# Patient Record
Sex: Female | Born: 1940
Health system: Southern US, Community
[De-identification: ages and names within clinical notes are randomized; demographics above are authoritative.]

## PROBLEM LIST (undated history)

## (undated) DIAGNOSIS — Z789 Other specified health status: Secondary | ICD-10-CM

## (undated) HISTORY — PX: APPENDECTOMY: SHX54

## (undated) HISTORY — PX: ABDOMINAL HYSTERECTOMY: SHX81

---

## 2008-08-08 ENCOUNTER — Ambulatory Visit: Payer: Self-pay | Admitting: Family Medicine

## 2013-05-15 ENCOUNTER — Ambulatory Visit: Payer: Self-pay | Admitting: Internal Medicine

## 2015-02-23 DIAGNOSIS — Z85828 Personal history of other malignant neoplasm of skin: Secondary | ICD-10-CM | POA: Diagnosis not present

## 2015-02-23 DIAGNOSIS — Z08 Encounter for follow-up examination after completed treatment for malignant neoplasm: Secondary | ICD-10-CM | POA: Diagnosis not present

## 2015-02-23 DIAGNOSIS — Z1283 Encounter for screening for malignant neoplasm of skin: Secondary | ICD-10-CM | POA: Diagnosis not present

## 2018-04-03 DIAGNOSIS — H2510 Age-related nuclear cataract, unspecified eye: Secondary | ICD-10-CM | POA: Diagnosis not present

## 2018-04-03 DIAGNOSIS — H5203 Hypermetropia, bilateral: Secondary | ICD-10-CM | POA: Diagnosis not present

## 2018-04-03 DIAGNOSIS — Z0100 Encounter for examination of eyes and vision without abnormal findings: Secondary | ICD-10-CM | POA: Diagnosis not present

## 2018-04-08 DIAGNOSIS — R030 Elevated blood-pressure reading, without diagnosis of hypertension: Secondary | ICD-10-CM | POA: Diagnosis not present

## 2018-08-06 DIAGNOSIS — L03211 Cellulitis of face: Secondary | ICD-10-CM | POA: Diagnosis not present

## 2019-06-17 ENCOUNTER — Emergency Department
Admission: EM | Admit: 2019-06-17 | Discharge: 2019-06-17 | Disposition: A | Discharge status: Home / Self Care | Payer: Medicare HMO | Attending: Emergency Medicine | Admitting: Emergency Medicine

## 2019-06-17 ENCOUNTER — Emergency Department: Payer: Medicare HMO

## 2019-06-17 ENCOUNTER — Encounter: Payer: Self-pay | Admitting: Emergency Medicine

## 2019-06-17 ENCOUNTER — Ambulatory Visit (INDEPENDENT_AMBULATORY_CARE_PROVIDER_SITE_OTHER): Payer: Medicare HMO

## 2019-06-17 ENCOUNTER — Ambulatory Visit (INDEPENDENT_AMBULATORY_CARE_PROVIDER_SITE_OTHER)
Admission: EM | Admit: 2019-06-17 | Discharge: 2019-06-17 | Disposition: A | Discharge status: Other Acute Inpatient Hospital | Payer: Medicare HMO | Source: Home / Self Care

## 2019-06-17 ENCOUNTER — Other Ambulatory Visit: Payer: Self-pay

## 2019-06-17 DIAGNOSIS — W5532XA Struck by other hoof stock, initial encounter: Secondary | ICD-10-CM | POA: Insufficient documentation

## 2019-06-17 DIAGNOSIS — Y999 Unspecified external cause status: Secondary | ICD-10-CM | POA: Insufficient documentation

## 2019-06-17 DIAGNOSIS — S43015A Anterior dislocation of left humerus, initial encounter: Secondary | ICD-10-CM

## 2019-06-17 DIAGNOSIS — Y93K9 Activity, other involving animal care: Secondary | ICD-10-CM

## 2019-06-17 DIAGNOSIS — S43005D Unspecified dislocation of left shoulder joint, subsequent encounter: Secondary | ICD-10-CM | POA: Diagnosis not present

## 2019-06-17 DIAGNOSIS — Y9289 Other specified places as the place of occurrence of the external cause: Secondary | ICD-10-CM | POA: Insufficient documentation

## 2019-06-17 DIAGNOSIS — S43035A Inferior dislocation of left humerus, initial encounter: Secondary | ICD-10-CM | POA: Diagnosis not present

## 2019-06-17 DIAGNOSIS — M25512 Pain in left shoulder: Secondary | ICD-10-CM | POA: Diagnosis not present

## 2019-06-17 DIAGNOSIS — W2209XA Striking against other stationary object, initial encounter: Secondary | ICD-10-CM | POA: Diagnosis not present

## 2019-06-17 DIAGNOSIS — S4992XA Unspecified injury of left shoulder and upper arm, initial encounter: Secondary | ICD-10-CM | POA: Diagnosis present

## 2019-06-17 MED ORDER — ONDANSETRON HCL 4 MG/2ML IJ SOLN
4.0000 mg | Freq: Once | INTRAMUSCULAR | Status: AC
  Administered 2019-06-17: 4 mg via INTRAVENOUS
  Filled 2019-06-17: qty 2

## 2019-06-17 MED ORDER — ETOMIDATE 2 MG/ML IV SOLN
0.3000 mg/kg | Freq: Once | INTRAVENOUS | Status: AC
  Administered 2019-06-17: 19.74 mg via INTRAVENOUS
  Filled 2019-06-17: qty 10

## 2019-06-17 MED ORDER — SODIUM CHLORIDE 0.9 % IV BOLUS
1000.0000 mL | Freq: Once | INTRAVENOUS | Status: AC
  Administered 2019-06-17: 1000 mL via INTRAVENOUS

## 2019-06-17 MED ORDER — MORPHINE SULFATE (PF) 2 MG/ML IV SOLN
2.0000 mg | Freq: Once | INTRAVENOUS | Status: AC
  Administered 2019-06-17: 2 mg via INTRAVENOUS
  Filled 2019-06-17: qty 1

## 2019-06-17 MED ORDER — MORPHINE SULFATE (PF) 4 MG/ML IV SOLN
4.0000 mg | Freq: Once | INTRAVENOUS | Status: AC
  Administered 2019-06-17: 4 mg via INTRAVENOUS
  Filled 2019-06-17: qty 1

## 2019-06-17 MED ORDER — OXYCODONE-ACETAMINOPHEN 5-325 MG PO TABS: 1.0000 | ORAL_TABLET | Freq: Four times a day (QID) | ORAL | 0 refills | Status: AC | PRN

## 2019-06-17 MED ORDER — IBUPROFEN 200 MG PO TABS: 600.0000 mg | ORAL_TABLET | Freq: Four times a day (QID) | ORAL | 0 refills | Status: DC | PRN

## 2019-06-17 NOTE — ED Notes (Signed)
X-ray at bedside

## 2019-06-17 NOTE — ED Triage Notes (Signed)
Pt sent from urgent care for dislocated left shoulder. States she was holding the donkey for the farrier to trim his feet and it slung her up against a fence post yesterday.

## 2019-06-17 NOTE — ED Provider Notes (Signed)
[facility] Emergency Department Provider Note  ____________________________________________  Time seen: Approximately 12:25 PM  I have reviewed the triage vital signs and the nursing notes.   HISTORY  Chief Complaint Shoulder Pain    HPI Connie Lee is a 78 y.o. female with no significant past medical history who comes the ED complaining of left shoulder pain that is acute in onset that started yesterday after she was slung against a fence by a donkey.  She was helping change the donkey shoes with it reacted.  Denies any pain anywhere except in her left shoulder.  Hurts to move it, she feels like she cannot lift it.  No headache or neck pain or loss of consciousness.  No shortness of breath chest pain vomiting or diarrhea.   Pain is constant and severe.  A little bit alleviated by holding the arm stationary.     History reviewed. No pertinent past medical history.   There are no active problems to display for this patient.    Past Surgical History:  Procedure Laterality Date  . ABDOMINAL HYSTERECTOMY    . APPENDECTOMY       Prior to Admission medications   Medication Sig Start Date End Date Taking? Authorizing Provider  ibuprofen (MOTRIN IB) 200 MG tablet Take 3 tablets (600 mg total) by mouth every 6 (six) hours as needed. 06/17/19   Sharman CheekStafford, Fleming Prill, MD  oxyCODONE-acetaminophen (PERCOCET) 5-325 MG tablet Take 1 tablet by mouth every 6 (six) hours as needed for severe pain. 06/17/19 06/16/20  Sharman CheekStafford, Shelbia Scinto, MD     Allergies Patient has no known allergies.   No family history on file.  Social History Social History   Tobacco Use  . Smoking status: Never Smoker  . Smokeless tobacco: Never Used  Substance Use Topics  . Alcohol use: Not Currently  . Drug use: Not Currently    Review of Systems  Constitutional:   No fever or chills.  ENT:   No sore throat. No rhinorrhea. Cardiovascular:   No chest pain or  syncope. Respiratory:   No dyspnea or cough. Gastrointestinal:   Negative for abdominal pain, vomiting and diarrhea.  Musculoskeletal:   Left shoulder pain as above All other systems reviewed and are negative except as documented above in ROS and HPI.  ____________________________________________   PHYSICAL EXAM:  VITAL SIGNS: ED Triage Vitals [06/17/19 1112]  Enc Vitals Group     BP (!) 182/84     Pulse Rate 77     Resp 18     Temp 98.2 F (36.8 C)     Temp Source Oral     SpO2 99 %     Weight 145 lb (65.8 kg)     Height 5\' 5"  (1.651 m)     Head Circumference      Peak Flow      Pain Score 9     Pain Loc      Pain Edu?      Excl. in GC?     Vital signs reviewed, nursing assessments reviewed.   Constitutional:   Alert and oriented. Non-toxic appearance. Eyes:   Conjunctivae are normal. EOMI. PERRL. ENT      Head:   Normocephalic and atraumatic.      Nose:   No congestion/rhinnorhea.       Mouth/Throat:   MMM, no pharyngeal erythema. No peritonsillar mass.       Neck:   No meningismus. Full ROM.  No midline neck tenderness Hematological/Lymphatic/Immunilogical:  No cervical lymphadenopathy. Cardiovascular:   RRR. Symmetric bilateral radial and DP pulses.  No murmurs. Cap refill less than 2 seconds. Respiratory:   Normal respiratory effort without tachypnea/retractions. Breath sounds are clear and equal bilaterally. No wheezes/rales/rhonchi. Gastrointestinal:   Soft and nontender. Non distended. There is no CVA tenderness.  No rebound, rigidity, or guarding.  Musculoskeletal:   Limited range of motion of the left shoulder, pain with left shoulder range of motion.  Relative emptiness of the subacromial space at the left shoulder with fullness anteriorly.  No focal bony tenderness.  Distal extremities unremarkable, nontender, compartments soft without swelling, normal distal perfusion sensation and motor function. Neurologic:   Normal speech and language.  Motor grossly  intact. Axillary nerve intact No acute focal neurologic deficits are appreciated.  Skin:    Skin is warm, dry and intact. No rash noted.  No petechiae, purpura, or bullae.  ____________________________________________    LABS (pertinent positives/negatives) (all labs ordered are listed, but only abnormal results are displayed) Labs Reviewed - No data to display ____________________________________________   EKG    ____________________________________________    RADIOLOGY  Dg Shoulder Left  Result Date: 06/17/2019 CLINICAL DATA:  Fall.  Shoulder pain. EXAM: LEFT SHOULDER - 2+ VIEW COMPARISON:  None. FINDINGS: There Is anterior inferior dislocation of the left humeral head. No visible fracture. Soft tissues are intact. IMPRESSION: Anterior inferior dislocation of the left humeral head. Electronically Signed   By: Rolm Baptise M.D.   On: 06/17/2019 10:26   Dg Shoulder Left Portable  Result Date: 06/17/2019 CLINICAL DATA:  Status post reduction of dislocation. EXAM: LEFT SHOULDER - 1 VIEW COMPARISON:  None. FINDINGS: There is no evidence of fracture or dislocation. There is no evidence of arthropathy or other focal bone abnormality. Soft tissues are unremarkable. IMPRESSION: Negative. Electronically Signed   By: Marijo Conception M.D.   On: 06/17/2019 14:37    ____________________________________________   PROCEDURES .Ortho Injury Treatment  Date/Time: 06/17/2019 2:57 PM Performed by: Carrie Mew, MD Authorized by: Carrie Mew, MD   Consent:    Consent obtained:  Verbal   Consent given by:  Patient   Risks discussed:  Fracture, nerve damage, recurrent dislocation and irreducible dislocation   Alternatives discussed:  ReferralInjury location: shoulder Location details: left shoulder Injury type: dislocation Dislocation type: anterior Hill-Sachs deformity: no Chronicity: new Pre-procedure neurovascular assessment: neurovascularly intact Pre-procedure distal  perfusion: normal Pre-procedure neurological function: normal Pre-procedure range of motion: reduced  Anesthesia: Local anesthesia used: no  Patient sedated: Yes. Refer to sedation procedure documentation for details of sedation. Manipulation performed: yes Reduction method: traction and counter traction Reduction successful: yes X-ray confirmed reduction: yes Immobilization: sling (Shoulder immobilizer) Post-procedure neurovascular assessment: post-procedure neurovascularly intact Post-procedure distal perfusion: normal Post-procedure neurological function: normal Post-procedure range of motion: improved Patient tolerance: patient tolerated the procedure well with no immediate complications Comments:      .Sedation  Date/Time: 06/17/2019 3:00 PM Performed by: Carrie Mew, MD Authorized by: Carrie Mew, MD   Consent:    Consent obtained:  Written (electronic informed consent)   Consent given by:  Patient   Risks discussed:  Allergic reaction, dysrhythmia, inadequate sedation, nausea, vomiting, respiratory compromise necessitating ventilatory assistance and intubation, prolonged sedation necessitating reversal and prolonged hypoxia resulting in organ damage   Alternatives discussed:  Analgesia without sedation Universal protocol:    Procedure explained and questions answered to patient or proxy's satisfaction: yes     Relevant documents present and verified: yes  Test results available and properly labeled: yes     Imaging studies available: yes     Required blood products, implants, devices, and special equipment available: yes     Immediately prior to procedure a time out was called: yes     Patient identity confirmation method:  Arm band, provided demographic data and verbally with patient Indications:    Procedure performed:  Dislocation reduction   Procedure necessitating sedation performed by:  Physician performing sedation Pre-sedation assessment:     Time since last food or drink:  8 hours   ASA classification: class 1 - normal, healthy patient     Neck mobility: normal     Mouth opening:  3 or more finger widths   Thyromental distance:  4 finger widths   Mallampati score:  I - soft palate, uvula, fauces, pillars visible   Pre-sedation assessments completed and reviewed: airway patency, cardiovascular function, hydration status, mental status, nausea/vomiting, pain level, respiratory function and temperature     Pre-sedation assessment completed:  06/17/2019 12:30 PM Immediate pre-procedure details:    Reassessment: Patient reassessed immediately prior to procedure     Reviewed: vital signs, relevant labs/tests and NPO status     Verified: bag valve mask available, emergency equipment available, intubation equipment available, IV patency confirmed, oxygen available, reversal medications available and suction available   Procedure details (see MAR for exact dosages):    Preoxygenation:  Room air   Sedation:  Etomidate   Analgesia:  Morphine   Intra-procedure monitoring:  Blood pressure monitoring, continuous pulse oximetry, cardiac monitor, frequent vital sign checks, frequent LOC assessments and continuous capnometry   Intra-procedure events: none     Total Provider sedation time (minutes):  20 Post-procedure details:    Post-sedation assessment completed:  06/17/2019 3:02 PM   Attendance: Constant attendance by certified staff until patient recovered     Recovery: Patient returned to pre-procedure baseline     Post-sedation assessments completed and reviewed: airway patency, cardiovascular function, hydration status, mental status, nausea/vomiting, pain level and respiratory function     Patient is stable for discharge or admission: yes     Patient tolerance:  Tolerated well, no immediate complications    ____________________________________________    CLINICAL IMPRESSION / ASSESSMENT AND PLAN / ED COURSE  Medications ordered in  the ED: Medications  morphine 4 MG/ML injection 4 mg (4 mg Intravenous Given 06/17/19 1243)  ondansetron (ZOFRAN) injection 4 mg (4 mg Intravenous Given 06/17/19 1243)  etomidate (AMIDATE) injection 19.74 mg (19.74 mg Intravenous Given 06/17/19 1343)  sodium chloride 0.9 % bolus 1,000 mL (0 mLs Intravenous Stopped 06/17/19 1456)  morphine 2 MG/ML injection 2 mg (2 mg Intravenous Given 06/17/19 1436)    Pertinent labs & imaging results that were available during my care of the patient were reviewed by me and considered in my medical decision making (see chart for details).  Connie Lee was evaluated in Emergency Department on 06/17/2019 for the symptoms described in the history of present illness. She was evaluated in the context of the global COVID-19 pandemic, which necessitated consideration that the patient might be at risk for infection with the SARS-CoV-2 virus that causes COVID-19. Institutional protocols and algorithms that pertain to the evaluation of patients at risk for COVID-19 are in a state of rapid change based on information released by regulatory bodies including the CDC and federal and state organizations. These policies and algorithms were followed during the patient's care in the ED.   Patient arrives  with left shoulder pain.  X-ray done at urgent care viewable in the chart shows an anterior inferior shoulder dislocation without any fracture.  Patient verbally consents to deep sedation for reduction.  N.p.o. since 6 AM.  No food or medication allergies, no other comorbidities or medications.  Plan to use morphine and Zofran for pain control, IV fluids, etomidate.  Clinical Course as of Jun 16 1456  Tue Jun 17, 2019  1357 Sedation and reduction completed without difficulty.  Patient awakened within a few minutes after immobilizer was placed.   [PS]    Clinical Course User Index [PS] Sharman CheekStafford, Jazzman Loughmiller, MD    ----------------------------------------- 2:57 PM on  06/17/2019 -----------------------------------------  Follow-up shoulder x-ray unremarkable without evidence of dislocation or fracture.  Stable for discharge home and follow-up with orthopedics.  Pain controlled, vital signs unremarkable, lucid.   ____________________________________________   FINAL CLINICAL IMPRESSION(S) / ED DIAGNOSES    Final diagnoses:  Closed anterior dislocation of left shoulder, initial encounter     ED Discharge Orders         Ordered    oxyCODONE-acetaminophen (PERCOCET) 5-325 MG tablet  Every 6 hours PRN     06/17/19 1457    ibuprofen (MOTRIN IB) 200 MG tablet  Every 6 hours PRN     06/17/19 1457          Portions of this note were generated with dragon dictation software. Dictation errors may occur despite best attempts at proofreading.   Sharman CheekStafford, Vasilia Dise, MD 06/17/19 [phone]

## 2019-06-17 NOTE — Discharge Instructions (Addendum)
Keep the shoulder immobilizer on at all times to prevent recurrent injury of your shoulder and allow your shoulder to heal. Follow up with orthopedics in 1 week for reassessment.

## 2019-06-17 NOTE — ED Triage Notes (Signed)
Pt c/o left shoulder pain. She fell yesterday morning. She states her shoulder is better than it was yesterday. No other injuries

## 2019-06-17 NOTE — ED Provider Notes (Signed)
[place]   Name: Connie Lee DOB: 21-Sep-1941 MRN: [id number] CSN: [id number] PCP: System, Pcp Not In  Arrival date and time:  06/17/19 2376  Chief Complaint:  Fall   NOTE: Prior to seeing the patient today, I have reviewed the triage nursing documentation and vital signs. Clinical staff has updated patient's PMH/PSHx, current medication list, and drug allergies/intolerances to ensure comprehensive history available to assist in medical decision making.   History:   HPI: Connie Lee is a 78 y.o. female who presents today with complaints of LEFT shoulder pain following a fall that occurred yesterday. Patient describes that injury occurred when she was assisting the farrier with her animals when the donkey kicked her and "slammed her" into a metal gate. Incident occurred yesterday morning around 9 or 10. Patient initially unable to abduct extremity, however throughout the day yesterday, she developed more mobility in her LEFT upper extremity. Patient reporting that the pain exacerbated through the night to the point of causing her to be unable to sleep. Patient denies previous injuries to her shoulder; no surgeries. Last took 3 Tylenol tablets at around 0600 this morning, which "help very little".  Patient presents with pain rated 9/10.  History reviewed. No pertinent past medical history.  Past Surgical History:  Procedure Laterality Date   ABDOMINAL HYSTERECTOMY     APPENDECTOMY      History reviewed. No pertinent family history.  Social History   Tobacco Use   Smoking status: Never Smoker   Smokeless tobacco: Never Used  Substance Use Topics   Alcohol use: Not Currently   Drug use: Not Currently    There are no active problems to display for this patient.   Home Medications:    No outpatient medications have been marked as taking for the 06/17/19 encounter [facility] Encounter).    Allergies:   Patient has no known allergies.  Review of Systems  (ROS): Review of Systems  Constitutional: Negative for chills and fever.  Respiratory: Negative for cough and shortness of breath.   Cardiovascular: Negative for chest pain and palpitations.  Musculoskeletal:       Acute LEFT shoulder pain  Neurological: Positive for weakness (LUE 2/2 acute injury). Negative for dizziness, syncope and headaches.  All other systems reviewed and are negative.    Vital Signs: Today's Vitals   06/17/19 0940 06/17/19 0944 06/17/19 0946 06/17/19 1044  BP:  (!) 165/111 (!) 177/80   Pulse:  (!) 106    Resp:  18    Temp:  98.3 F (36.8 C)    TempSrc:  Oral    SpO2:  100%    Weight: 145 lb (65.8 kg)     Height: 5\' 5"  (1.651 m)     PainSc: 9    9     Physical Exam: Physical Exam  Constitutional: She is oriented to person, place, and time and well-developed, well-nourished, and in no distress.  HENT:  Head: Normocephalic and atraumatic.  Mouth/Throat: Mucous membranes are normal.  Cardiovascular: Normal rate, regular rhythm, normal heart sounds and intact distal pulses. Exam reveals no gallop and no friction rub.  No murmur heard. Pulmonary/Chest: Effort normal and breath sounds normal. No respiratory distress. She has no wheezes. She has no rales.  Musculoskeletal:     Left shoulder: She exhibits decreased range of motion, tenderness, swelling, deformity, pain and decreased strength. She exhibits normal pulse.     Comments: LUE reveals (+) PMS distally; cap refill and temperature normal. Exam limited  by decreased AROM/PROM due to pain.   Neurological: She is alert and oriented to person, place, and time. Gait normal. GCS score is 15.  Skin: Skin is warm and dry. No rash noted.  Psychiatric: Mood, memory, affect and judgment normal.  Nursing note and vitals reviewed.  Urgent Care Treatments / Results:   LABS: PLEASE NOTE: all labs that were ordered this encounter are listed, however only abnormal results are displayed. Labs Reviewed - No data to  display  EKG: -None  RADIOLOGY: Dg Shoulder Left  Result Date: 06/17/2019 CLINICAL DATA:  Fall.  Shoulder pain. EXAM: LEFT SHOULDER - 2+ VIEW COMPARISON:  None. FINDINGS: There Is anterior inferior dislocation of the left humeral head. No visible fracture. Soft tissues are intact. IMPRESSION: Anterior inferior dislocation of the left humeral head. Electronically Signed   By: Charlett NoseKevin  Dover M.D.   On: 06/17/2019 10:26    PROCEDURES: Procedures  MEDICATIONS RECEIVED THIS VISIT: Medications - No data to display  PERTINENT CLINICAL COURSE NOTES/UPDATES: Clinical Course as of Jun 16 1048  Tue Jun 17, 2019  1040 [facility] (spoke with Laural BenesJohnson, RN). Patient being sent for reduction of anterior shoulder dislocation, as we have no medications for pain/sedation in the UC setting.    [BG]    Clinical Course User Index [BG] Verlee MonteGray, Kaori Jumper E, NP   Initial Impression / Assessment and Plan / Urgent Care Course:  Pertinent labs & imaging results that were available during my care of the patient were personally reviewed by me and considered in my medical decision making (see lab/imaging section of note for values and interpretations).  Connie Lee is a 78 y.o. female who [facility] today with complaints of shoulder pain following an animal related injury that occurred yesterday.    Patient presents to clinic today in obvious discomfort. She does not appear to be in any acute distress. Presenting symptoms (see HPI) and exam as documented above. Diagnostic radiographs reveals an anterior inferior dislocation of the LEFT humeral head. Patient is in significant pain. Discussed results with patient and her daughter. Shoulder has been dislocated since yesterday. Limited on capabilities in the UC setting as we do not have medications for pain or sedation. Reviewed with patient that she would be better served in the emergency department where the closed reduction  could be performed under moderate sedation. Patient and daughter in agreement. Plans are for them to proceed to the ED at [facility] directly from clinic. Report called to charge nurse (see clinical notes).   Final Clinical Impressions / Urgent Care Diagnoses:   Final diagnoses:  Anterior shoulder dislocation, left, initial encounter    New Prescriptions:  [place] Controlled Substance Registry consulted? Not Applicable  No orders of the defined types were placed in this encounter.   Recommended Follow up Care:  Patient encouraged to follow up with the following provider within the specified time frame, or sooner as dictated by the severity of her symptoms. As always, she was instructed that for any urgent/emergent care needs, she should seek care either here or in the emergency department for more immediate evaluation.  Follow-up Information    Go to  [facility] EMERGENCY DEPARTMENT.   Specialty: Emergency Medicine Contact information: [address] [id number] [address] [place] [id number] [phone]        NOTE: This note was prepared using Dragon dictation software along with smaller phrase technology. Despite my best ability to proofread, there  is the potential that transcriptional errors may still occur from this process, and are completely unintentional.    Verlee MonteGray, Ilija Maxim E, NP 06/18/19 [phone]

## 2019-06-17 NOTE — ED Notes (Signed)
Pt awake and talking at this time. Moving all extremities, skin color is WNL. VSS.

## 2019-06-17 NOTE — Sedation Documentation (Signed)
Shoulder feels back in place at this time per MD [facility].

## 2019-06-17 NOTE — Discharge Instructions (Signed)
Your shoulder is dislocated. Leave here and go directly to the ER. I have notified them that you are coming.   Honor Loh, MSN, APRN, FNP-C, CEN Advanced Practice Provider [facility] 06/17/2019 10:40 AM

## 2019-06-17 NOTE — ED Notes (Signed)
Pt arrive to ED ambulatory, was seen at [facility] and informed L shoulder dislocated. Pt was working with a donkey yesterday to get shoes on the donkey and donkey shoved pt into metal fence. Pt was initially diaphoretic from pain. This happened yesterday. States took 3 tylenol this AM. A&O, daughter at bedside.

## 2019-06-25 DIAGNOSIS — S43015A Anterior dislocation of left humerus, initial encounter: Secondary | ICD-10-CM | POA: Diagnosis not present

## 2019-07-23 DIAGNOSIS — S43015A Anterior dislocation of left humerus, initial encounter: Secondary | ICD-10-CM | POA: Diagnosis not present

## 2019-07-31 DIAGNOSIS — M7582 Other shoulder lesions, left shoulder: Secondary | ICD-10-CM | POA: Diagnosis not present

## 2019-07-31 DIAGNOSIS — M25612 Stiffness of left shoulder, not elsewhere classified: Secondary | ICD-10-CM | POA: Diagnosis not present

## 2019-07-31 DIAGNOSIS — M25512 Pain in left shoulder: Secondary | ICD-10-CM | POA: Diagnosis not present

## 2019-08-04 DIAGNOSIS — M7582 Other shoulder lesions, left shoulder: Secondary | ICD-10-CM | POA: Diagnosis not present

## 2019-08-04 DIAGNOSIS — M25512 Pain in left shoulder: Secondary | ICD-10-CM | POA: Diagnosis not present

## 2019-08-04 DIAGNOSIS — M25612 Stiffness of left shoulder, not elsewhere classified: Secondary | ICD-10-CM | POA: Diagnosis not present

## 2019-08-06 DIAGNOSIS — M7582 Other shoulder lesions, left shoulder: Secondary | ICD-10-CM | POA: Diagnosis not present

## 2019-08-06 DIAGNOSIS — M25512 Pain in left shoulder: Secondary | ICD-10-CM | POA: Diagnosis not present

## 2019-08-06 DIAGNOSIS — M25612 Stiffness of left shoulder, not elsewhere classified: Secondary | ICD-10-CM | POA: Diagnosis not present

## 2019-08-11 DIAGNOSIS — M25512 Pain in left shoulder: Secondary | ICD-10-CM | POA: Diagnosis not present

## 2019-08-11 DIAGNOSIS — M7582 Other shoulder lesions, left shoulder: Secondary | ICD-10-CM | POA: Diagnosis not present

## 2019-08-11 DIAGNOSIS — M25612 Stiffness of left shoulder, not elsewhere classified: Secondary | ICD-10-CM | POA: Diagnosis not present

## 2019-08-13 DIAGNOSIS — M7582 Other shoulder lesions, left shoulder: Secondary | ICD-10-CM | POA: Diagnosis not present

## 2019-08-13 DIAGNOSIS — M25612 Stiffness of left shoulder, not elsewhere classified: Secondary | ICD-10-CM | POA: Diagnosis not present

## 2019-08-13 DIAGNOSIS — M25512 Pain in left shoulder: Secondary | ICD-10-CM | POA: Diagnosis not present

## 2019-08-18 DIAGNOSIS — M7582 Other shoulder lesions, left shoulder: Secondary | ICD-10-CM | POA: Diagnosis not present

## 2019-08-18 DIAGNOSIS — M25612 Stiffness of left shoulder, not elsewhere classified: Secondary | ICD-10-CM | POA: Diagnosis not present

## 2019-08-18 DIAGNOSIS — M25512 Pain in left shoulder: Secondary | ICD-10-CM | POA: Diagnosis not present

## 2019-08-20 DIAGNOSIS — M25512 Pain in left shoulder: Secondary | ICD-10-CM | POA: Diagnosis not present

## 2019-08-20 DIAGNOSIS — M25612 Stiffness of left shoulder, not elsewhere classified: Secondary | ICD-10-CM | POA: Diagnosis not present

## 2019-08-20 DIAGNOSIS — M7582 Other shoulder lesions, left shoulder: Secondary | ICD-10-CM | POA: Diagnosis not present

## 2019-08-20 DIAGNOSIS — S43015A Anterior dislocation of left humerus, initial encounter: Secondary | ICD-10-CM | POA: Diagnosis not present

## 2019-08-25 DIAGNOSIS — M25612 Stiffness of left shoulder, not elsewhere classified: Secondary | ICD-10-CM | POA: Diagnosis not present

## 2019-08-25 DIAGNOSIS — M25512 Pain in left shoulder: Secondary | ICD-10-CM | POA: Diagnosis not present

## 2019-08-25 DIAGNOSIS — M7582 Other shoulder lesions, left shoulder: Secondary | ICD-10-CM | POA: Diagnosis not present

## 2019-08-27 DIAGNOSIS — M25512 Pain in left shoulder: Secondary | ICD-10-CM | POA: Diagnosis not present

## 2019-08-27 DIAGNOSIS — M7582 Other shoulder lesions, left shoulder: Secondary | ICD-10-CM | POA: Diagnosis not present

## 2019-08-27 DIAGNOSIS — M25612 Stiffness of left shoulder, not elsewhere classified: Secondary | ICD-10-CM | POA: Diagnosis not present

## 2019-09-02 DIAGNOSIS — M25612 Stiffness of left shoulder, not elsewhere classified: Secondary | ICD-10-CM | POA: Diagnosis not present

## 2019-09-02 DIAGNOSIS — M25512 Pain in left shoulder: Secondary | ICD-10-CM | POA: Diagnosis not present

## 2019-09-02 DIAGNOSIS — M7582 Other shoulder lesions, left shoulder: Secondary | ICD-10-CM | POA: Diagnosis not present

## 2019-09-09 DIAGNOSIS — M25612 Stiffness of left shoulder, not elsewhere classified: Secondary | ICD-10-CM | POA: Diagnosis not present

## 2019-09-09 DIAGNOSIS — M25512 Pain in left shoulder: Secondary | ICD-10-CM | POA: Diagnosis not present

## 2019-09-09 DIAGNOSIS — M7582 Other shoulder lesions, left shoulder: Secondary | ICD-10-CM | POA: Diagnosis not present

## 2019-09-16 DIAGNOSIS — M25612 Stiffness of left shoulder, not elsewhere classified: Secondary | ICD-10-CM | POA: Diagnosis not present

## 2019-09-16 DIAGNOSIS — M25512 Pain in left shoulder: Secondary | ICD-10-CM | POA: Diagnosis not present

## 2019-09-16 DIAGNOSIS — M7582 Other shoulder lesions, left shoulder: Secondary | ICD-10-CM | POA: Diagnosis not present

## 2019-12-30 DIAGNOSIS — H5203 Hypermetropia, bilateral: Secondary | ICD-10-CM | POA: Diagnosis not present

## 2019-12-30 DIAGNOSIS — H35363 Drusen (degenerative) of macula, bilateral: Secondary | ICD-10-CM | POA: Diagnosis not present

## 2019-12-30 DIAGNOSIS — H18593 Other hereditary corneal dystrophies, bilateral: Secondary | ICD-10-CM | POA: Diagnosis not present

## 2019-12-30 DIAGNOSIS — H2513 Age-related nuclear cataract, bilateral: Secondary | ICD-10-CM | POA: Diagnosis not present

## 2019-12-30 DIAGNOSIS — Z0100 Encounter for examination of eyes and vision without abnormal findings: Secondary | ICD-10-CM | POA: Diagnosis not present

## 2020-10-07 DIAGNOSIS — R001 Bradycardia, unspecified: Secondary | ICD-10-CM | POA: Diagnosis not present

## 2020-10-07 DIAGNOSIS — R197 Diarrhea, unspecified: Secondary | ICD-10-CM | POA: Diagnosis not present

## 2020-10-07 DIAGNOSIS — R748 Abnormal levels of other serum enzymes: Secondary | ICD-10-CM | POA: Diagnosis not present

## 2020-10-07 DIAGNOSIS — R110 Nausea: Secondary | ICD-10-CM | POA: Diagnosis not present

## 2020-10-07 DIAGNOSIS — U071 COVID-19: Secondary | ICD-10-CM | POA: Diagnosis not present

## 2020-10-07 DIAGNOSIS — R531 Weakness: Secondary | ICD-10-CM | POA: Diagnosis not present

## 2020-10-07 DIAGNOSIS — R918 Other nonspecific abnormal finding of lung field: Secondary | ICD-10-CM | POA: Diagnosis not present

## 2020-10-07 DIAGNOSIS — R7989 Other specified abnormal findings of blood chemistry: Secondary | ICD-10-CM | POA: Diagnosis not present

## 2020-10-07 DIAGNOSIS — K858 Other acute pancreatitis without necrosis or infection: Secondary | ICD-10-CM | POA: Diagnosis not present

## 2020-10-07 DIAGNOSIS — R0602 Shortness of breath: Secondary | ICD-10-CM | POA: Diagnosis not present

## 2020-10-08 IMAGING — CR LEFT SHOULDER - 2+ VIEW
2 series · 2 of 2 positions shown · non-contrast
Comparison: None.

CLINICAL DATA: Fall.  Shoulder pain.

EXAM:
LEFT SHOULDER - 2+ VIEW

[shoulder grashey]
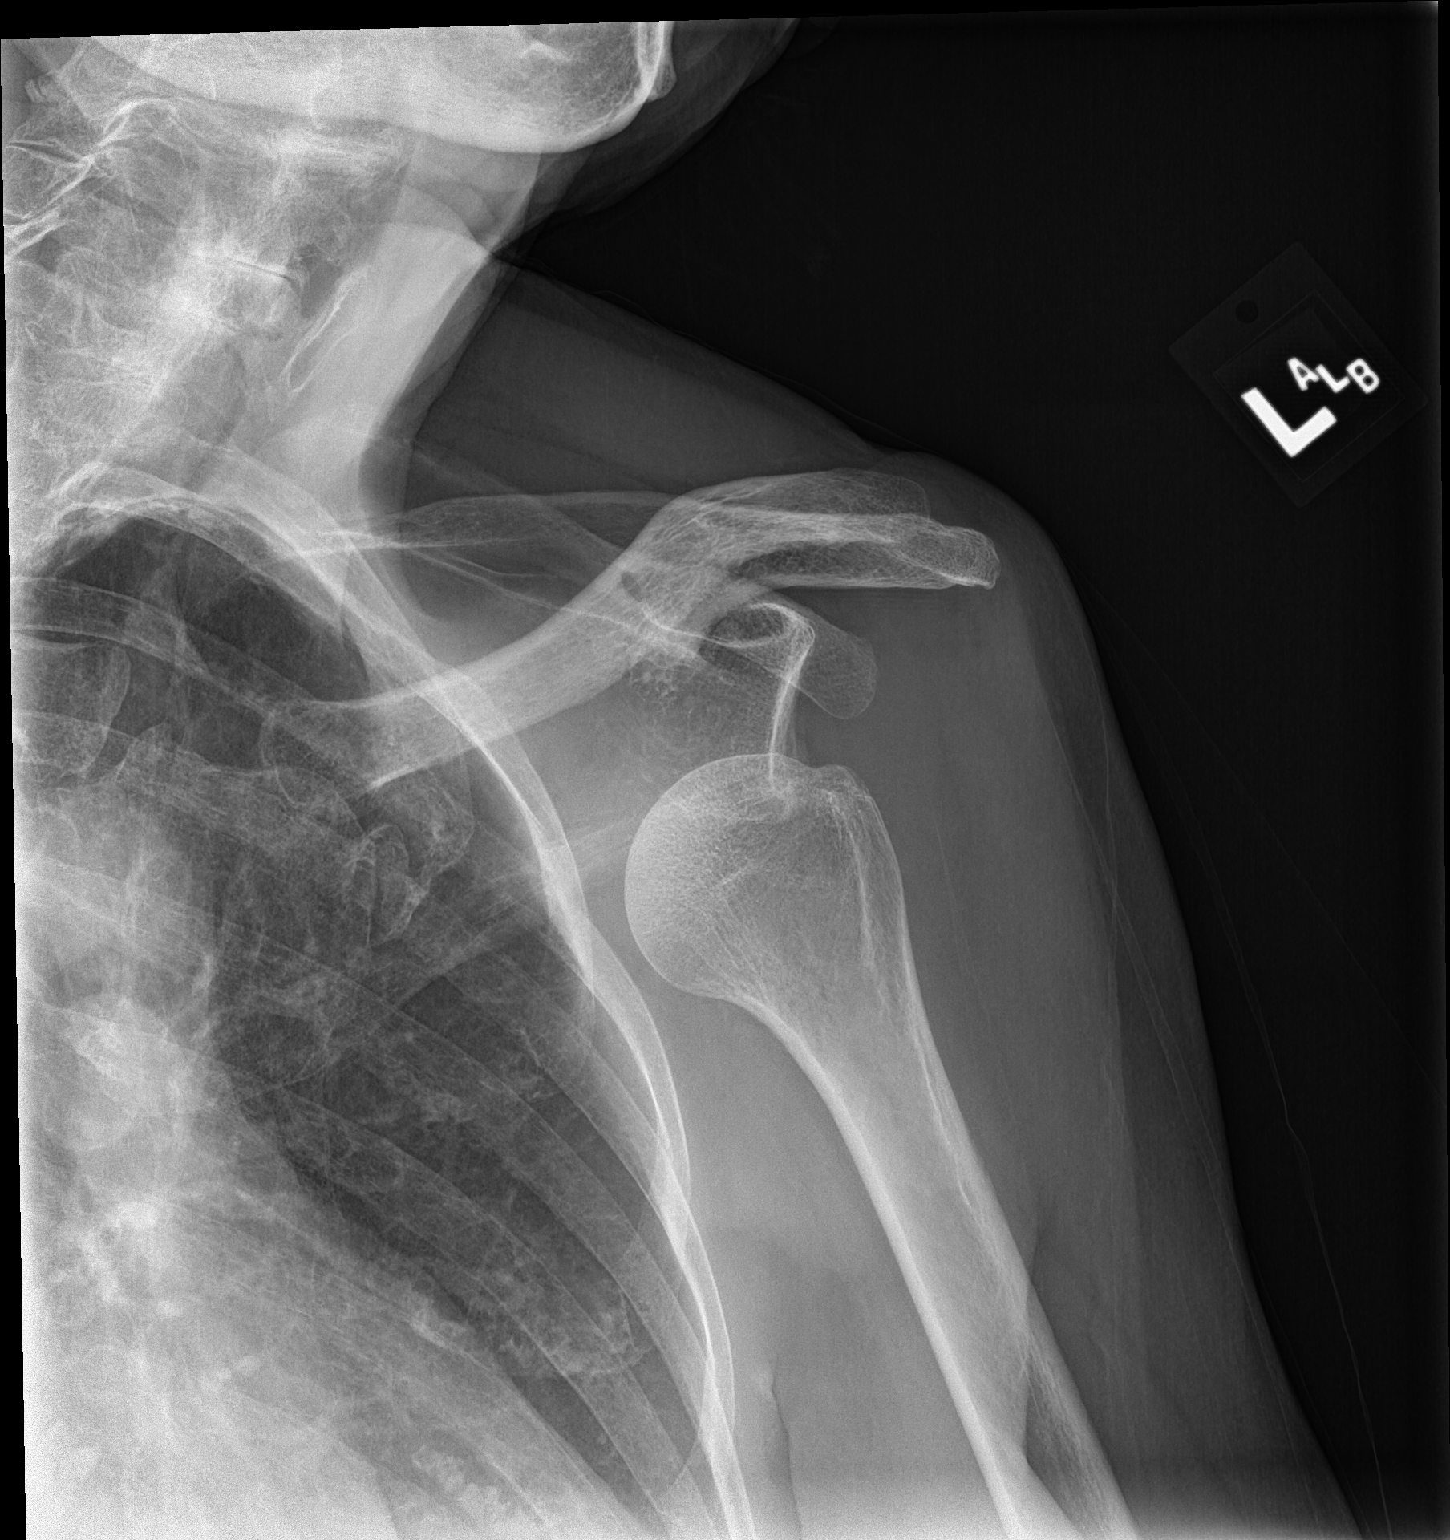

[shoulder y view]
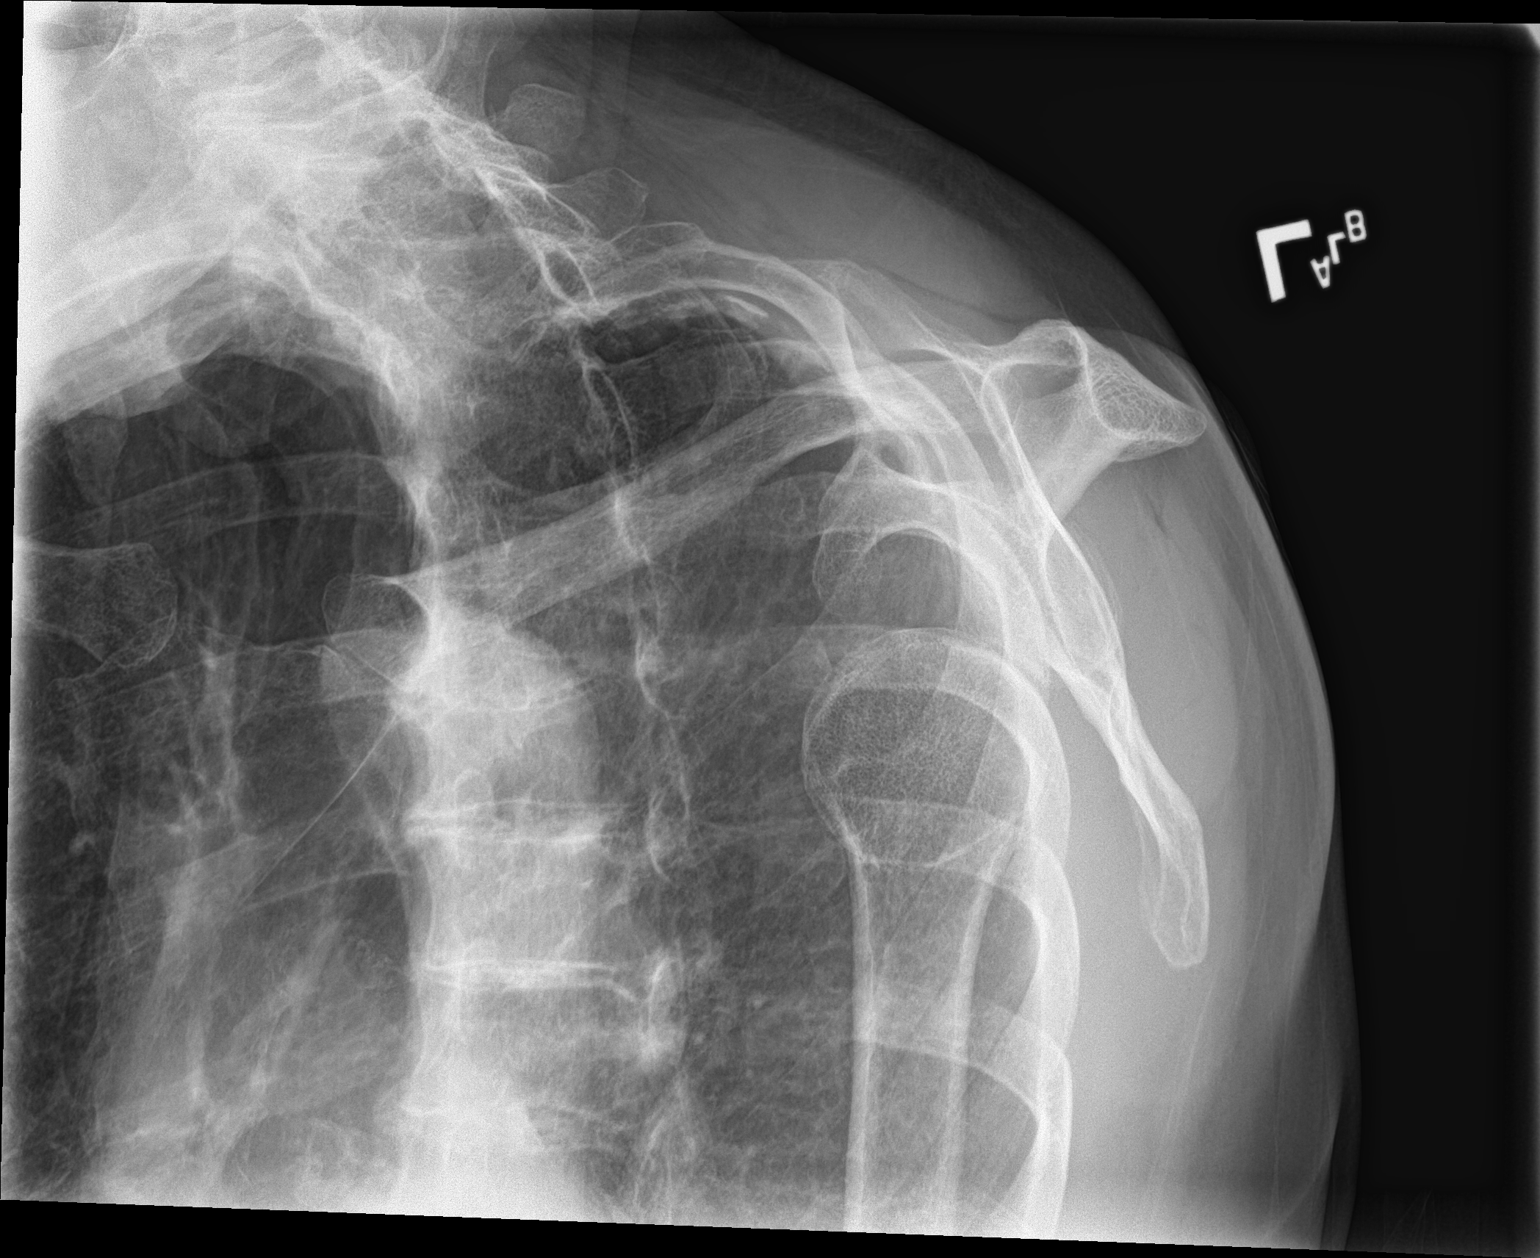

[2 of 2 positions shown; findings below may reference images not displayed]

FINDINGS: There Is anterior inferior dislocation of the left humeral head. No
visible fracture. Soft tissues are intact.
IMPRESSION: Anterior inferior dislocation of the left humeral head.

## 2020-10-08 IMAGING — DX LEFT SHOULDER - 1 VIEW
2 series · 2 of 2 positions shown · non-contrast
Comparison: None.

CLINICAL DATA: Status post reduction of dislocation.

EXAM:
LEFT SHOULDER - 1 VIEW

[shoulder obl (1 of 2)]
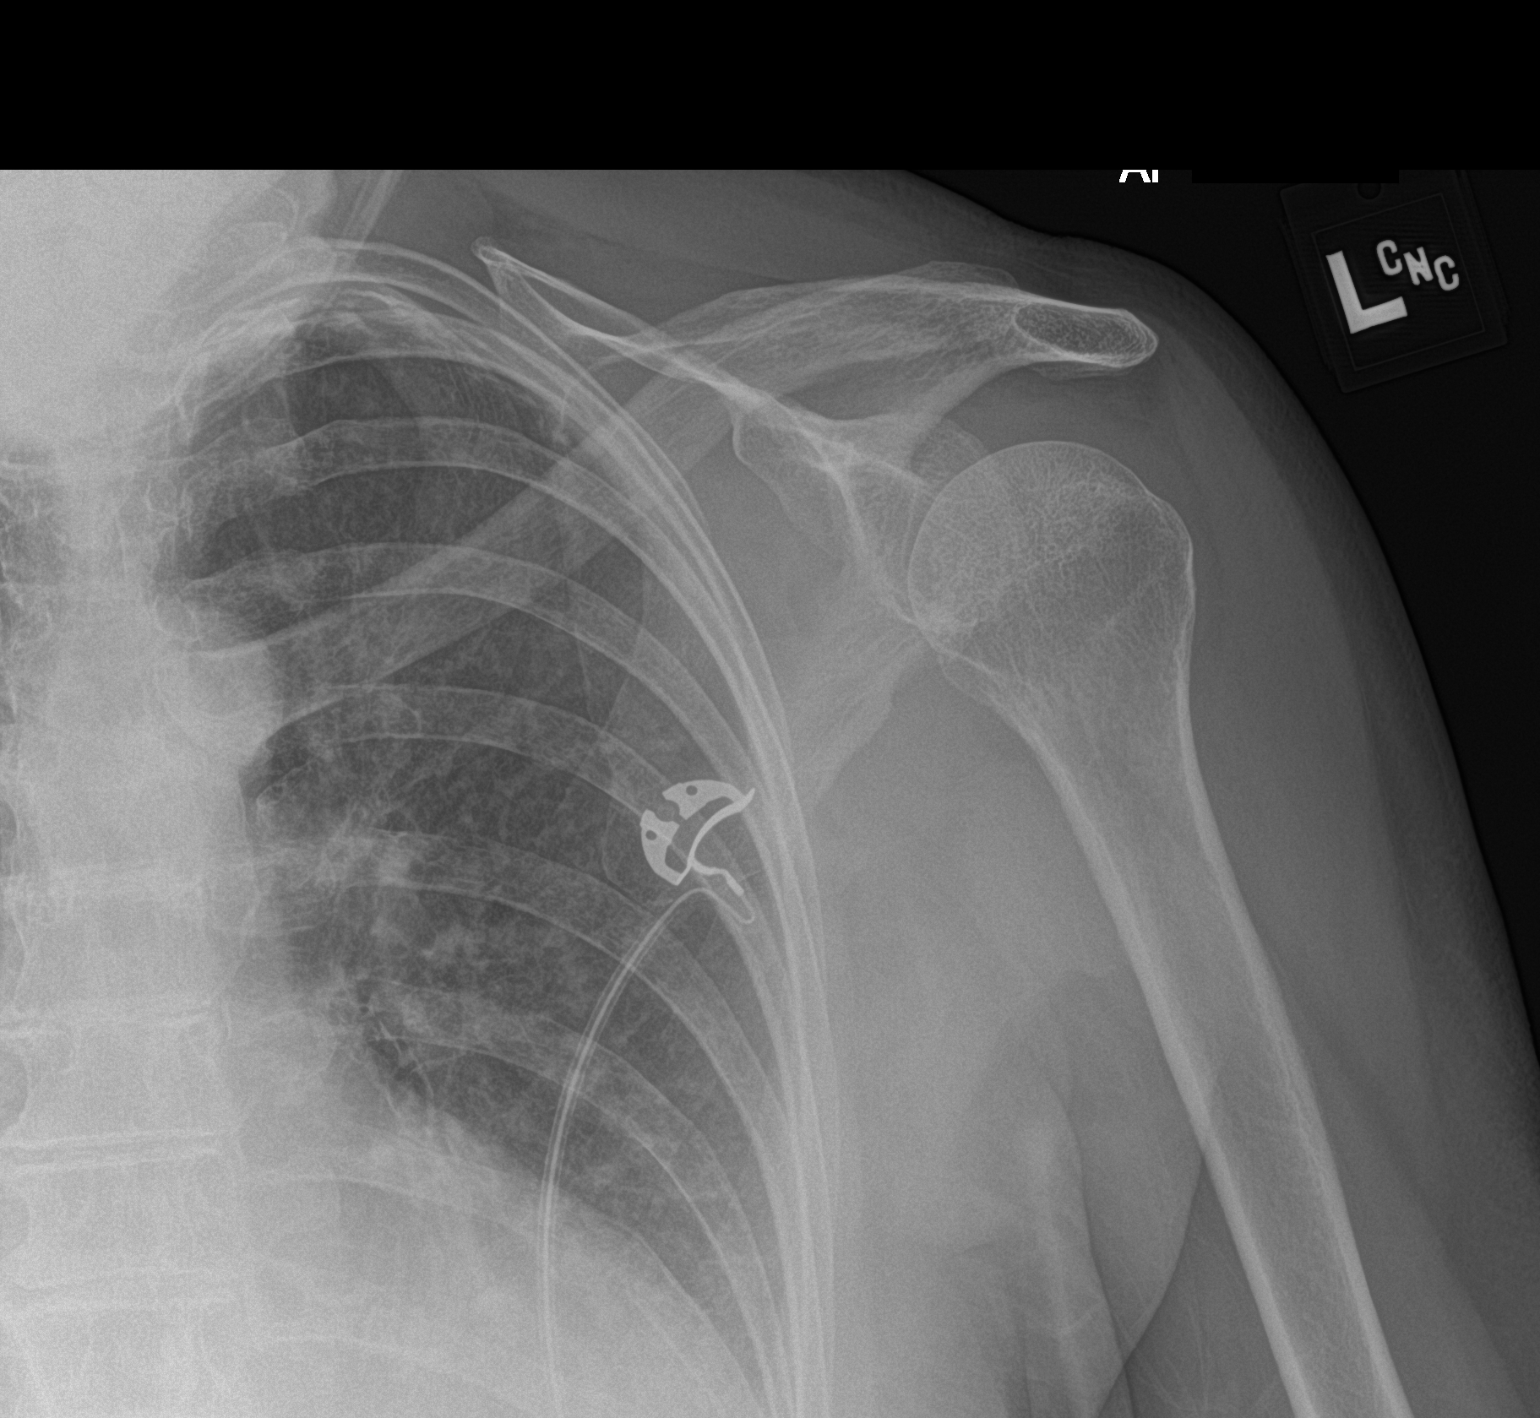

[shoulder obl (2 of 2)]
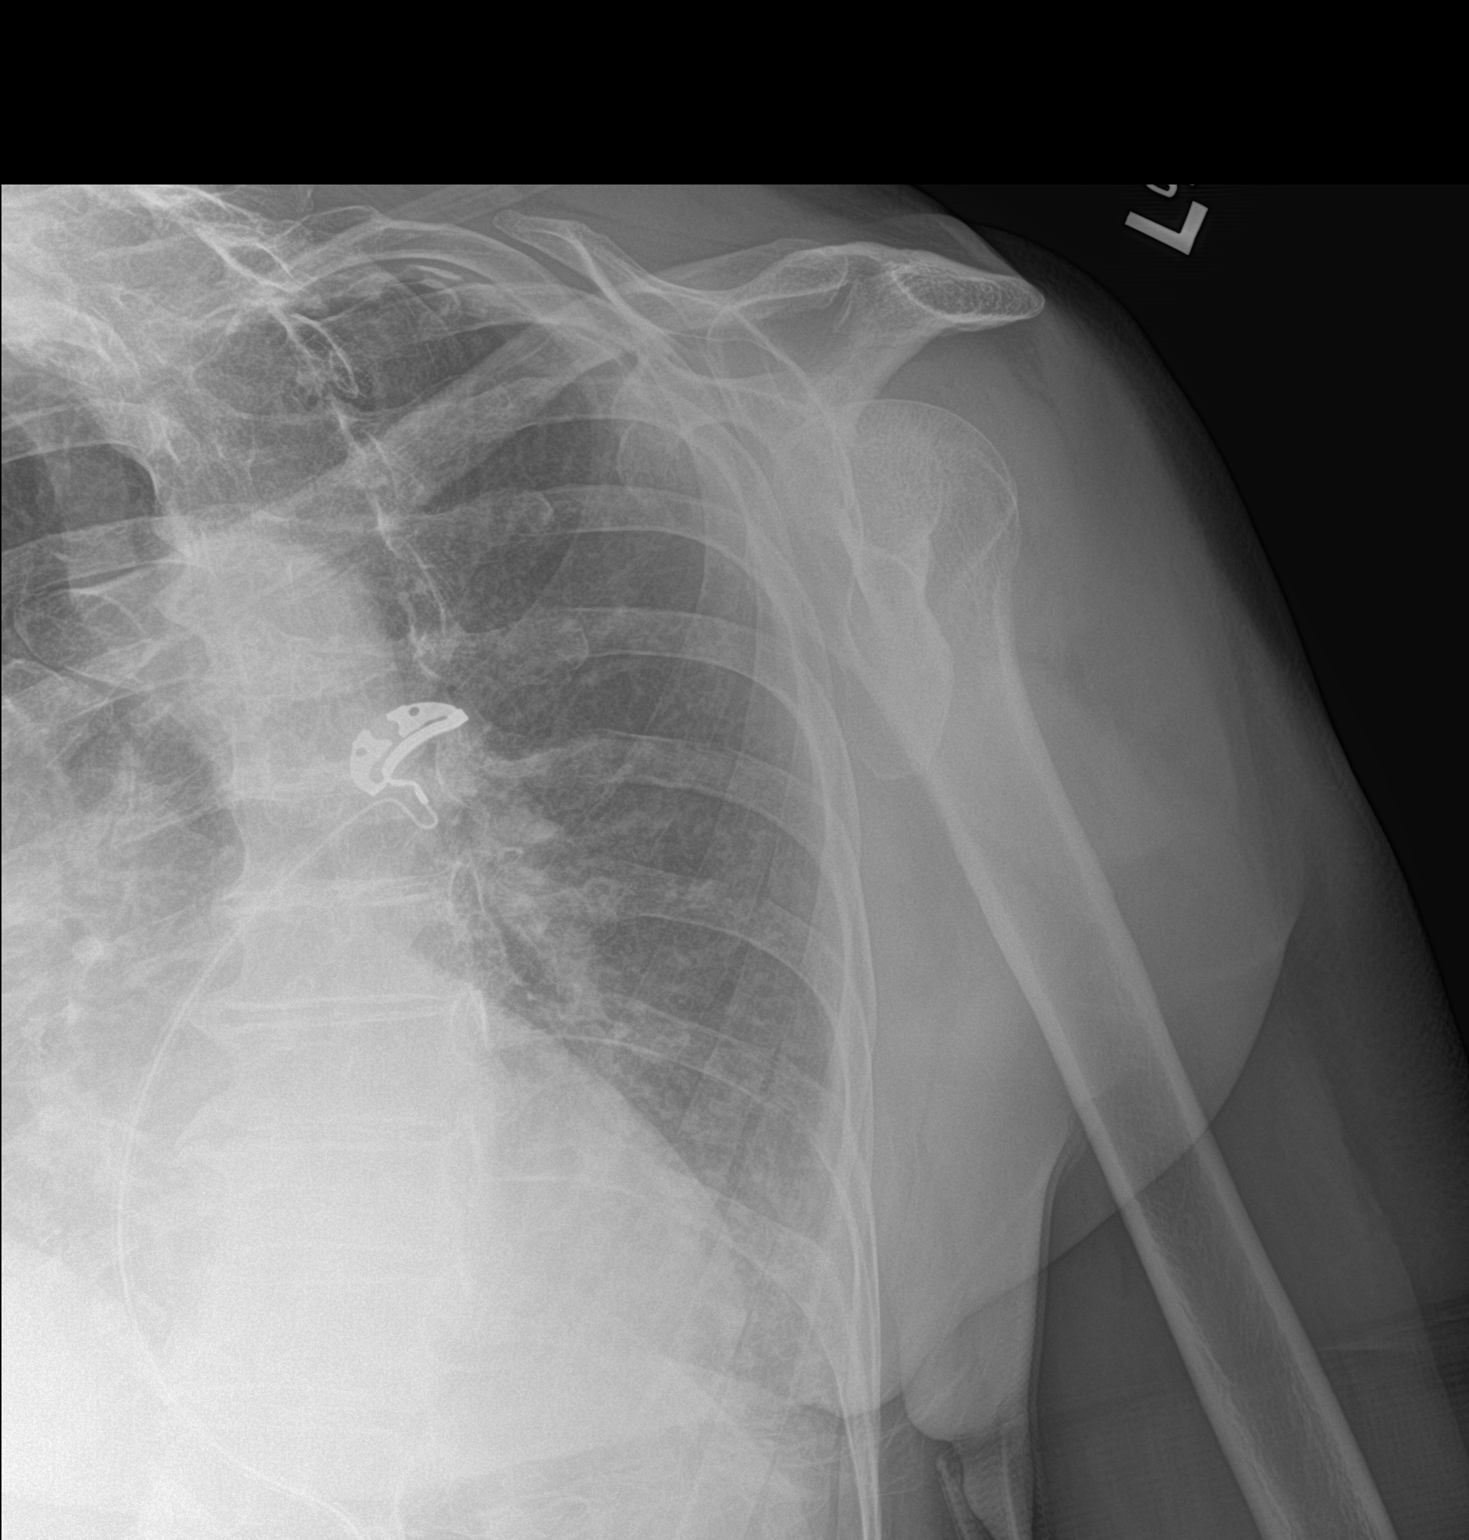

[2 of 2 positions shown; findings below may reference images not displayed]

FINDINGS: There is no evidence of fracture or dislocation. There is no
evidence of arthropathy or other focal bone abnormality. Soft
tissues are unremarkable.
IMPRESSION: Negative.

## 2020-11-12 ENCOUNTER — Other Ambulatory Visit: Payer: Self-pay

## 2020-11-12 ENCOUNTER — Encounter: Payer: Self-pay | Admitting: Family Medicine

## 2020-11-12 ENCOUNTER — Ambulatory Visit (INDEPENDENT_AMBULATORY_CARE_PROVIDER_SITE_OTHER): Payer: Medicare HMO | Admitting: Family Medicine

## 2020-11-12 VITALS — BP 130/76 | HR 72 | Ht 65.0 in | Wt 138.0 lb

## 2020-11-12 DIAGNOSIS — U099 Post covid-19 condition, unspecified: Secondary | ICD-10-CM

## 2020-11-12 DIAGNOSIS — R748 Abnormal levels of other serum enzymes: Secondary | ICD-10-CM | POA: Diagnosis not present

## 2020-11-12 DIAGNOSIS — Z7689 Persons encountering health services in other specified circumstances: Secondary | ICD-10-CM | POA: Diagnosis not present

## 2020-11-12 DIAGNOSIS — R7989 Other specified abnormal findings of blood chemistry: Secondary | ICD-10-CM | POA: Diagnosis not present

## 2020-11-12 NOTE — Progress Notes (Signed)
Date:  11/12/2020   Name:  Connie Lee   DOB:  May 06, 1941   MRN:  [id number]   Chief Complaint: Establish Care (Seen in ER on 10/07/2020- Dx with pancreatitis- needs referral to GI)  Patient is a 80 year old female who presents for a comprehensive physical exam. The patient reports the following problems: follow up pancreatitis. Health maintenance has been reviewed needs immunizatiions.  GI Problem Primary symptoms do not include fever, weight loss, fatigue, abdominal pain, nausea, diarrhea, melena, hematemesis, jaundice, hematochezia, dysuria, myalgias, arthralgias or rash. The problem has been resolved.  The illness does not include chills, anorexia, dysphagia, odynophagia, bloating, constipation, tenesmus, back pain or itching. Associated medical issues do not include alcohol abuse. Associated medical issues comments: pancreatitis.    No results found for: CREATININE, BUN, NA, K, CL, CO2 No results found for: CHOL, HDL, LDLCALC, LDLDIRECT, TRIG, CHOLHDL No results found for: TSH No results found for: HGBA1C No results found for: WBC, HGB, HCT, MCV, PLT No results found for: ALT, AST, GGT, ALKPHOS, BILITOT   Review of Systems  Constitutional: Negative for chills, fatigue, fever and weight loss.  Gastrointestinal: Negative for abdominal pain, anorexia, bloating, constipation, diarrhea, dysphagia, hematemesis, hematochezia, jaundice, melena and nausea.  Genitourinary: Negative for dysuria.  Musculoskeletal: Negative for arthralgias, back pain and myalgias.  Skin: Negative for itching and rash.    There are no problems to display for this patient.   No Known Allergies  Past Surgical History:  Procedure Laterality Date  . ABDOMINAL HYSTERECTOMY    . APPENDECTOMY      Social History   Tobacco Use  . Smoking status: Never Smoker  . Smokeless tobacco: Never Used  Vaping Use  . Vaping Use: Never used  Substance Use Topics  . Alcohol use: Not Currently  . Drug  use: Not Currently     Medication list has been reviewed and updated.  Current Meds  Medication Sig  . ibuprofen (MOTRIN IB) 200 MG tablet Take 3 tablets (600 mg total) by mouth every 6 (six) hours as needed.    PHQ 2/9 Scores 11/12/2020  PHQ - 2 Score 0    No flowsheet data found.  BP Readings from Last 3 Encounters:  11/12/20 130/76  06/17/19 (!) 153/75  06/17/19 (!) 177/80    Physical Exam  Wt Readings from Last 3 Encounters:  11/12/20 138 lb (62.6 kg)  06/17/19 145 lb (65.8 kg)  06/17/19 145 lb (65.8 kg)    BP 130/76   Pulse 72   Ht 5\' 5"  (1.651 m)   Wt 138 lb (62.6 kg)   BMI 22.96 kg/m   Assessment and Plan: 1. Establishing care with new doctor, encounter for Patient is status post-COVID infection presumably from exposure to grandchild.  Patient is doing well and is approximately 5 weeks out.  Patient was treated with infusion and has steadily been improving.  Patient is established with care and we will be evaluating the following.  2. Post covid-19 condition, unspecified As noted above patient is status post COVID infection on 10/07/2020.  Patient is doing very well but had some abnormal labs that need to be repeated.  We will check an elevated lipase and an abnormality in her CBC.  Patient's chart was reviewed including her CT scan which was for the most part unremarkable. - Hepatic Function Panel (6) - Lipase - Renal Function Panel - CBC with Differential/Platelet  3. Elevated lipase Patient had an elevated lipase in the 1500  range.  We will check hepatic function panel and lipase exam is unremarkable for any discomfort or palpable mass or hepatomegaly/splenomegaly. - Hepatic Function Panel (6) - Lipase  4. Abnormal CBC Noted that the patient had elevated hematocrit hemoglobin reflecting probably a dehydration and mild decrease of the platelet which could possibly been a post-COVID concern.  We will recheck a CBC to see if this is returned to normal. -  CBC with Differential/Platelet

## 2020-11-13 LAB — HEPATIC FUNCTION PANEL (6)
ALT: 14 IU/L (ref 0–32)
AST: 23 IU/L (ref 0–40)
Alkaline Phosphatase: 63 IU/L (ref 44–121)
Bilirubin Total: 0.4 mg/dL (ref 0.0–1.2)
Bilirubin, Direct: 0.14 mg/dL (ref 0.00–0.40)

## 2020-11-13 LAB — RENAL FUNCTION PANEL
Albumin: 5.1 g/dL — ABNORMAL HIGH (ref 3.7–4.7)
BUN/Creatinine Ratio: 18 (ref 12–28)
BUN: 17 mg/dL (ref 8–27)
CO2: 25 mmol/L (ref 20–29)
Calcium: 10.1 mg/dL (ref 8.7–10.3)
Chloride: 102 mmol/L (ref 96–106)
Creatinine, Ser: 0.97 mg/dL (ref 0.57–1.00)
GFR calc Af Amer: 64 mL/min/{1.73_m2} (ref >59)
GFR calc non Af Amer: 55 mL/min/{1.73_m2} — ABNORMAL LOW (ref >59)
Glucose: 102 mg/dL — ABNORMAL HIGH (ref 65–99)
Phosphorus: 3.3 mg/dL (ref 3.0–4.3)
Potassium: 4.2 mmol/L (ref 3.5–5.2)
Sodium: 142 mmol/L (ref 134–144)

## 2020-11-13 LAB — CBC WITH DIFFERENTIAL/PLATELET
Basophils Absolute: 0.1 10*3/uL (ref 0.0–0.2)
Basos: 1 %
EOS (ABSOLUTE): 0.1 10*3/uL (ref 0.0–0.4)
Eos: 2 %
Hematocrit: 45.3 % (ref 34.0–46.6)
Hemoglobin: 15 g/dL (ref 11.1–15.9)
Immature Grans (Abs): 0 10*3/uL (ref 0.0–0.1)
Immature Granulocytes: 0 %
Lymphocytes Absolute: 2.8 10*3/uL (ref 0.7–3.1)
Lymphs: 39 %
MCH: 30.5 pg (ref 26.6–33.0)
MCHC: 33.1 g/dL (ref 31.5–35.7)
MCV: 92 fL (ref 79–97)
Monocytes Absolute: 0.7 10*3/uL (ref 0.1–0.9)
Monocytes: 10 %
Neutrophils Absolute: 3.5 10*3/uL (ref 1.4–7.0)
Neutrophils: 48 %
Platelets: 192 10*3/uL (ref 150–450)
RBC: 4.91 x10E6/uL (ref 3.77–5.28)
RDW: 12.8 % (ref 11.7–15.4)
WBC: 7.2 10*3/uL (ref 3.4–10.8)

## 2020-11-13 LAB — LIPASE: Lipase: 44 U/L (ref 14–85)

## 2020-11-15 ENCOUNTER — Encounter: Payer: Self-pay | Admitting: Emergency Medicine

## 2020-11-15 ENCOUNTER — Ambulatory Visit (INDEPENDENT_AMBULATORY_CARE_PROVIDER_SITE_OTHER): Payer: Medicare HMO

## 2020-11-15 ENCOUNTER — Ambulatory Visit
Admission: EM | Admit: 2020-11-15 | Discharge: 2020-11-15 | Disposition: A | Discharge status: Home / Self Care | Payer: Medicare HMO | Attending: Internal Medicine | Admitting: Internal Medicine

## 2020-11-15 ENCOUNTER — Other Ambulatory Visit: Payer: Self-pay

## 2020-11-15 DIAGNOSIS — S2232XA Fracture of one rib, left side, initial encounter for closed fracture: Secondary | ICD-10-CM

## 2020-11-15 DIAGNOSIS — S2242XA Multiple fractures of ribs, left side, initial encounter for closed fracture: Secondary | ICD-10-CM | POA: Diagnosis not present

## 2020-11-15 DIAGNOSIS — R0782 Intercostal pain: Secondary | ICD-10-CM | POA: Diagnosis not present

## 2020-11-15 MED ORDER — ACETAMINOPHEN 500 MG PO TABS
500.0000 mg | ORAL_TABLET | Freq: Once | ORAL | Status: AC
  Administered 2020-11-15: 500 mg via ORAL

## 2020-11-15 MED ORDER — LIDOCAINE 5 % EX PTCH: 1.0000 | MEDICATED_PATCH | CUTANEOUS | 0 refills | Status: DC

## 2020-11-15 NOTE — ED Provider Notes (Signed)
[facility]    CSN: [id number] Arrival date & time: 11/15/20  1258      History   Chief Complaint Chief Complaint  Patient presents with  . Fall  . Rib Injury    left    HPI Connie Lee is a 80 y.o. female who presents with L rib pain x 2 days after falling on her L side from one step. She was going up to her storage in a hurry and missed her step and was going backwards and landed on her L thorax. Denies LOC, or pain anywhere else. Has been taking Motrin and using a heating pad.  Deeps breaths and palpation on area causes worse pain  History reviewed. No pertinent past medical history.  There are no problems to display for this patient.   Past Surgical History:  Procedure Laterality Date  . ABDOMINAL HYSTERECTOMY    . APPENDECTOMY      OB History   No obstetric history on file.      Home Medications    Prior to Admission medications   Medication Sig Start Date End Date Taking? Authorizing Provider  lidocaine (LIDODERM) 5 % Place 1 patch onto the skin daily. Remove & Discard patch within 12 hours or as directed by MD 11/15/20  Yes Rodriguez-Southworth, Nettie Elm, PA-C  ibuprofen (MOTRIN IB) 200 MG tablet Take 3 tablets (600 mg total) by mouth every 6 (six) hours as needed. 06/17/19   Sharman Cheek, MD    Family History History reviewed. No pertinent family history.  Social History Social History   Tobacco Use  . Smoking status: Never Smoker  . Smokeless tobacco: Never Used  Vaping Use  . Vaping Use: Never used  Substance Use Topics  . Alcohol use: Not Currently  . Drug use: Not Currently     Allergies   Patient has no known allergies.   Review of Systems Review of Systems  Constitutional: Negative for fever.  Respiratory: Negative for cough and shortness of breath.   Gastrointestinal: Negative for abdominal pain.  Musculoskeletal: Negative for arthralgias, back pain, gait problem and neck pain.  Skin: Negative for rash and  wound.     Physical Exam Triage Vital Signs ED Triage Vitals  Enc Vitals Group     BP 11/15/20 1324 (!) 159/88     Pulse Rate 11/15/20 1324 64     Resp 11/15/20 1324 18     Temp 11/15/20 1324 98.2 F (36.8 C)     Temp Source 11/15/20 1324 Oral     SpO2 11/15/20 1324 100 %     Weight 11/15/20 1322 138 lb 0.1 oz (62.6 kg)     Height 11/15/20 1322 5\' 5"  (1.651 m)     Head Circumference --      Peak Flow --      Pain Score 11/15/20 1321 9     Pain Loc --      Pain Edu? --      Excl. in GC? --    No data found.  Updated Vital Signs BP (!) 159/88 (BP Location: Left Arm)   Pulse 64   Temp 98.2 F (36.8 C) (Oral)   Resp 18   Ht 5\' 5"  (1.651 m)   Wt 138 lb 0.1 oz (62.6 kg)   SpO2 100%   BMI 22.97 kg/m   Visual Acuity Right Eye Distance:   Left Eye Distance:   Bilateral Distance:    Right Eye Near:   Left Eye Near:  Bilateral Near:     Physical Exam Vitals and nursing note reviewed.  Constitutional:      General: She is not in acute distress.    Appearance: She is normal weight. She is not toxic-appearing.  HENT:     Head: Normocephalic.     Right Ear: External ear normal.     Left Ear: External ear normal.  Eyes:     General: No scleral icterus.    Conjunctiva/sclera: Conjunctivae normal.  Cardiovascular:     Rate and Rhythm: Normal rate and regular rhythm.  Pulmonary:     Effort: Pulmonary effort is normal.     Breath sounds: Normal breath sounds.     Comments: Has severe tenderness on anterior lower rib region and laterally. No crepitations noted.  Chest:     Chest wall: Tenderness present.  Musculoskeletal:        General: No tenderness or deformity. Normal range of motion.     Cervical back: Neck supple. No tenderness.  Skin:    General: Skin is warm and dry.     Findings: No bruising or rash.  Neurological:     Mental Status: She is alert and oriented to person, place, and time.     Gait: Gait normal.  Psychiatric:        Mood and Affect:  Mood normal.        Behavior: Behavior normal.        Thought Content: Thought content normal.        Judgment: Judgment normal.     UC Treatments / Results  Labs (all labs ordered are listed, but only abnormal results are displayed) Labs Reviewed - No data to display  EKG   Radiology DG Ribs Unilateral W/Chest Left  Result Date: 11/15/2020 CLINICAL DATA:  Left rib pain after fall 2 days ago. EXAM: LEFT RIBS AND CHEST - 3+ VIEW COMPARISON:  None. FINDINGS: Mildly displaced fractures are seen involving the lateral portions of the left ninth and tenth ribs. There is no evidence of pneumothorax or pleural effusion. Both lungs are clear. Heart size and mediastinal contours are within normal limits. IMPRESSION: Mildly displaced left ninth and tenth rib fractures. Electronically Signed   By: Lupita Raider M.D.   On: 11/15/2020 14:13    Procedures Procedures (including critical care time)  Medications Ordered in UC Medications  acetaminophen (TYLENOL) tablet 500 mg (500 mg Oral Given 11/15/20 1356)    Initial Impression / Assessment and Plan / UC Course  I have reviewed the triage vital signs and the nursing notes. Has mildly displaced 9th L rib fracture.  Pertinent  imaging results that were available during my care of the patient were reviewed by me and considered in my medical decision making (see chart for details). I sent Rx for Lidoderm patches and educated her on how to use them. Taught how to exercise her lungs using a pillow pressing on her ribs, and to do this several times a day to prevent pneumonia. See instructions.    Final Clinical Impressions(s) / UC Diagnoses   Final diagnoses:  Traumatic closed fracture of one rib with minimal displacement, left, initial encounter     Discharge Instructions     You may take Tylenol 500 mg every 6 hours for pain. I sent a prescription for a pain patch which you need to wear only for 12 hour in 24 hours.  Use a pillow to take  deep breaths to prevent pneumonia.  Follow up with  your family Dr Next week. May take 6-8 weeks before the pain improves.  If you get sudden shortness of breath, and worse pain, please go to the Emergency room.     ED Prescriptions    Medication Sig Dispense Auth. Provider   lidocaine (LIDODERM) 5 % Place 1 patch onto the skin daily. Remove & Discard patch within 12 hours or as directed by MD 30 patch Rodriguez-Southworth, Nettie Elm, PA-C     PDMP not reviewed this encounter.   Garey Ham, [place] 11/15/20 1850

## 2020-11-15 NOTE — Discharge Instructions (Signed)
You may take Tylenol 500 mg every 6 hours for pain. I sent a prescription for a pain patch which you need to wear only for 12 hour in 24 hours.  Use a pillow to take deep breaths to prevent pneumonia.  Follow up with your family Dr Next week. May take 6-8 weeks before the pain improves.  If you get sudden shortness of breath, and worse pain, please go to the Emergency room.

## 2020-11-15 NOTE — ED Triage Notes (Signed)
Pt fell about 2 days ago. She states she thinks she missed the step and fell on the ground. She is having pain in the left side of her rib cage. She has not noticed bruising.

## 2020-11-23 ENCOUNTER — Ambulatory Visit (INDEPENDENT_AMBULATORY_CARE_PROVIDER_SITE_OTHER): Payer: Medicare HMO | Admitting: Family Medicine

## 2020-11-23 ENCOUNTER — Encounter: Payer: Self-pay | Admitting: Family Medicine

## 2020-11-23 ENCOUNTER — Other Ambulatory Visit: Payer: Self-pay

## 2020-11-23 VITALS — BP 140/80 | HR 80 | Ht 65.0 in | Wt 140.0 lb

## 2020-11-23 DIAGNOSIS — S2242XD Multiple fractures of ribs, left side, subsequent encounter for fracture with routine healing: Secondary | ICD-10-CM | POA: Diagnosis not present

## 2020-11-23 NOTE — Progress Notes (Signed)
Date:  11/23/2020   Name:  Connie Lee   DOB:  11/20/1940   MRN:  [id number]   Chief Complaint: Follow-up Larey Seat on Jan 17- fx a rib on L) side)  Fall The accident occurred more than 1 week ago. Fall occurred: while going up steps. Impact surface: steps. Point of impact: left thorax. Pain location: left lateral chest wall. The pain is mild. Exacerbated by: bending over/sneeze/cough. Pertinent negatives include no abdominal pain, fever, headaches, hematuria, nausea, numbness or vomiting. Associated symptoms comments: No cough/dyspnea. She has tried acetaminophen (lidocaine patches) for the symptoms. The treatment provided mild relief.    Lab Results  Component Value Date   CREATININE 0.97 11/12/2020   BUN 17 11/12/2020   NA 142 11/12/2020   K 4.2 11/12/2020   CL 102 11/12/2020   CO2 25 11/12/2020   No results found for: CHOL, HDL, LDLCALC, LDLDIRECT, TRIG, CHOLHDL No results found for: TSH No results found for: HGBA1C Lab Results  Component Value Date   WBC 7.2 11/12/2020   HGB 15.0 11/12/2020   HCT 45.3 11/12/2020   MCV 92 11/12/2020   PLT 192 11/12/2020   Lab Results  Component Value Date   ALT 14 11/12/2020   AST 23 11/12/2020   ALKPHOS 63 11/12/2020   BILITOT 0.4 11/12/2020     Review of Systems  Constitutional: Negative.  Negative for chills, fatigue, fever and unexpected weight change.  HENT: Negative for congestion, ear discharge, ear pain, rhinorrhea, sinus pressure, sneezing and sore throat.   Eyes: Negative for double vision, photophobia, pain, discharge, redness and itching.  Respiratory: Negative for cough, chest tightness, shortness of breath, wheezing and stridor.        Localized chest wall pain  Cardiovascular: Positive for chest pain. Negative for palpitations and leg swelling.       Lateral chest wall pain  Gastrointestinal: Negative for abdominal pain, blood in stool, constipation, diarrhea, nausea and vomiting.  Endocrine: Negative for cold  intolerance, heat intolerance, polydipsia, polyphagia and polyuria.  Genitourinary: Negative for dysuria, flank pain, frequency, hematuria, menstrual problem, pelvic pain, urgency, vaginal bleeding and vaginal discharge.  Musculoskeletal: Negative for arthralgias, back pain and myalgias.  Skin: Negative for rash.  Allergic/Immunologic: Negative for environmental allergies and food allergies.  Neurological: Negative for dizziness, weakness, light-headedness, numbness and headaches.  Hematological: Negative for adenopathy. Does not bruise/bleed easily.  Psychiatric/Behavioral: Negative for dysphoric mood. The patient is not nervous/anxious.     There are no problems to display for this patient.   No Known Allergies  Past Surgical History:  Procedure Laterality Date  . ABDOMINAL HYSTERECTOMY    . APPENDECTOMY      Social History   Tobacco Use  . Smoking status: Never Smoker  . Smokeless tobacco: Never Used  Vaping Use  . Vaping Use: Never used  Substance Use Topics  . Alcohol use: Not Currently  . Drug use: Not Currently     Medication list has been reviewed and updated.  Current Meds  Medication Sig  . ibuprofen (MOTRIN IB) 200 MG tablet Take 3 tablets (600 mg total) by mouth every 6 (six) hours as needed.  . lidocaine (LIDODERM) 5 % Place 1 patch onto the skin daily. Remove & Discard patch within 12 hours or as directed by MD    PHQ 2/9 Scores 11/12/2020  PHQ - 2 Score 0    No flowsheet data found.  BP Readings from Last 3 Encounters:  11/23/20 140/80  11/15/20 Marland Kitchen)  159/88  11/12/20 130/76    Physical Exam Vitals and nursing note reviewed.  Constitutional:      Appearance: She is well-developed and well-nourished.  HENT:     Head: Normocephalic.     Right Ear: Tympanic membrane, ear canal and external ear normal.     Left Ear: Tympanic membrane, ear canal and external ear normal.     Mouth/Throat:     Mouth: Oropharynx is clear and moist.  Eyes:      General: Lids are everted, no foreign bodies appreciated. No scleral icterus.       Left eye: No foreign body or hordeolum.     Extraocular Movements: EOM normal.     Conjunctiva/sclera: Conjunctivae normal.     Right eye: Right conjunctiva is not injected.     Left eye: Left conjunctiva is not injected.     Pupils: Pupils are equal, round, and reactive to light.  Neck:     Thyroid: No thyromegaly.     Vascular: No JVD.     Trachea: No tracheal deviation.  Cardiovascular:     Rate and Rhythm: Normal rate and regular rhythm.     Pulses: Intact distal pulses.     Heart sounds: Normal heart sounds. No murmur heard. No friction rub. No gallop.   Pulmonary:     Effort: Pulmonary effort is normal. No respiratory distress.     Breath sounds: Normal breath sounds. No decreased air movement. No decreased breath sounds, wheezing, rhonchi or rales.  Chest:     Chest wall: Tenderness present. No deformity, crepitus or edema. There is no dullness to percussion.     Comments: Tender left posterior-lateral chest wall Abdominal:     General: Bowel sounds are normal.     Palpations: Abdomen is soft. There is no hepatosplenomegaly or mass.     Tenderness: There is no abdominal tenderness. There is no guarding or rebound.  Musculoskeletal:        General: No tenderness or edema. Normal range of motion.     Cervical back: Normal range of motion and neck supple.  Lymphadenopathy:     Cervical: No cervical adenopathy.  Skin:    General: Skin is warm.     Findings: No rash (rib fracture).  Neurological:     Mental Status: She is alert and oriented to person, place, and time.     Cranial Nerves: No cranial nerve deficit.     Deep Tendon Reflexes: Strength normal. Reflexes normal.  Psychiatric:        Mood and Affect: Mood and affect normal. Mood is not anxious or depressed.     Wt Readings from Last 3 Encounters:  11/23/20 140 lb (63.5 kg)  11/15/20 138 lb 0.1 oz (62.6 kg)  11/12/20 138 lb  (62.6 kg)    BP 140/80   Pulse 80   Ht 5\' 5"  (1.651 m)   Wt 140 lb (63.5 kg)   BMI 23.30 kg/m   Assessment and Plan:  1. Closed fracture of multiple ribs of left side with routine healing, subsequent encounter Acute.  Persistent.  This is subsequent visit for recheck of minimally displaced fracture of the left posterior lateral ninth and 10th ribs.  Patient has normal breath sounds underneath with no rales rhonchi wheezes or rubs.  Will continue Tylenol for pain.  Patient has been given some respiratory physical therapy to be done to prevent atelectasis.

## 2020-11-23 NOTE — Patient Instructions (Signed)
Chest Physical Therapy Chest physical therapy, also called chest physiotherapy, helps to break up mucus (secretions) in the lungs. This helps move the mucus into the large airways and makes it easier to cough the mucus out. Removing the mucus reduces the risk of lung infection and makes it easier to breathe. Chest physical therapy may be used every day for people with diseases that affect the lungs, such as bronchiectasis or cystic fibrosis. What are some common techniques? The methods that may be used include:  Postural drainage.  Percussion (clapping).  Vibration.  Coughing.  Deep breathing. Percussion and vibration may be done by hand (be manual) or by using a mechanical device. This can be an electric, handheld device or an inflatable chest physiotherapy vest. Manual methods of percussion and vibration need to be done by a respiratory therapist or a trained family member or caregiver. The chest physiotherapy vest can be used independently. Postural drainage For postural drainage, you will place your body in certain positions to drain mucus from the airways. The mucus drains into the larger airways using gravity. This makes it easier to cough out the mucus. Use a rolled towel or pillows to help position your body to improve comfort and drainage. Postural drainage can be done with or without percussion and vibration. Your health care provider will show you which positions to use. In some cases, your head will be lower than your hips. Percussion Percussion should not be done on bare skin. Use a towel, blanket, or piece of clothing to cover the skin before doing percussion. Manual percussion may be called cupping or clapping. To do this:  Use one or both hands in a cupped position to trap air between the hand and chest wall. You should hear a hollow sound when clapping the chest wall.  If both hands are used, alternate hands when clapping to create a slow, rhythmic pattern.  Only clap  over the rib cage area but not over hard bony areas like the shoulders, spine, or breastbone. Percussion should not hurt. Vibration Vibration transmits energy through the chest wall to loosen and move mucus. To do this:  For vibration with one hand, place a hand flat on the chest wall over the area to be vibrated. Tense the shoulders and arm muscles to create a gentle vibration (pulsing) motion.  For vibration with both hands, place one hand flat on the chest wall over the area to be vibrated. Place the other hand on top of the first. The person getting vibration therapy should take a deep breath in (inhale) and breathe out (exhale) slowly as the vibration is performed. Coughing All chest physical therapy sessions should end with deep breathing and coughing. The most common cough methods are controlled cough and assisted cough. You can do controlled cough by yourself. Sometimes, you may need help to cough if your muscles are weak; this is called assisted cough. This can be done by yourself, or another person can assist. To do a controlled cough:  Lean your head forward.  Inhale deeply.  Hold your breath for 2-3 seconds.  Cough 2 times. Keep your mouth slightly open.  Repeat 2-3 times or until the mucus feels coughed out. To do an assisted cough:  Sit up straight.  Place your hands just below your ribs on your stomach area.  Take 1-2 slow, deep breaths.  Firmly push in and up toward your ribs as you cough.  Cough 2 times. Keep your mouth slightly open.  Repeat 2-3 times  or until the mucus feels coughed out.  Use a mechanical device to assist with a cough, if needed. Deep breathing Deep-breathing exercises help expand your lungs. This gets more air behind the mucus so it can be coughed out more easily. Some common breathing techniques include:  Pursed-lip breathing. To do this: ? Inhale slowly and deeply through your nose. ? Pucker (purse) your lips and exhale slowly. ? The  exhaling phase should be longer than the inhaling phase.  Using an incentive spirometer. An incentive spirometer is a small, plastic chamber attached to a mouthpiece. Many have one or more balls inside the chamber. To do this: ? Inhale quickly and deeply until the balls rise. ? Exhale slowly. The balls will drop to the bottom of the chamber. ? Repeat 10 times or as often as told by your health care provider.   When is chest physical therapy done? This therapy is usually done at the following times:  In the morning. This helps to clear out mucus that has collected during sleep hours.  Before bed. This ensures a comfortable sleep during the night.  Before a meal. This ensures that your stomach is empty to prevent vomiting.  1-2 hours after a meal. This helps to prevent vomiting. Follow these instructions at home: General instructions  If at any time you feel pain during your chest physical therapy, stop the therapy.  You may not need to do all of the methods of chest physical therapy. Your health care provider will: ? Show you how to do the chest physical therapy methods that are best for you. ? Suggest if you should use manual or mechanical devices. ? Tell you the best times and how often to do chest physical therapy. ? Arrange for any equipment you will need at home. Contact a health care provider if:  You feel pain or discomfort after using any chest physical therapy techniques.  You are not able to cough up any mucus after chest physical therapy sessions.  You have a fever.  You make whistling sounds when you breathe (wheeze). Get help right away if:  You have trouble breathing.  You have severe pain after using any chest physical therapy techniques. These symptoms may represent a serious problem that is an emergency. Do not wait to see if the symptoms will go away. Get medical help right away. Call your local emergency services (911 in the [place] Do not drive yourself to  the hospital. Summary  Chest physical therapy helps to break up mucus (secretions) in the lungs. This helps move the mucus into the large airways and makes it easier to cough the mucus out.  The therapy is usually done in the morning, before bed, before eating, or 1-2 hours after eating.  Common methods of chest physical therapy include postural drainage, percussion, vibration, coughing, and deep breathing. This information is not intended to replace advice given to you by your health care provider. Make sure you discuss any questions you have with your health care provider. Document Revised: 11/27/2019 Document Reviewed: 11/27/2019 Elsevier Patient Education  2021 [employer] Rib Fracture  A rib fracture is a break or crack in one of the bones of the ribs. The ribs are long, curved bones that wrap around your chest and attach to your spine and your breastbone. The ribs protect your heart, lungs, and other organs in the chest. A broken or cracked rib is often painful but is not usually serious. Most rib fractures heal on their own  over time. However, rib fractures can be more serious if multiple ribs are broken or if broken ribs move out of place and push against other structures or organs. What are the causes? This condition is caused by:  Repetitive movements with high force, such as pitching a baseball or having very bad coughing spells.  A direct hit the chest, such as a sports injury, a car crash, or a fall.  Cancer that has spread to the bones, which can weaken bones and cause them to break. What are the signs or symptoms? Symptoms of this condition include:  Pain when you breathe in or cough.  Pain when someone presses on the injured area.  Feeling short of breath. How is this diagnosed? This condition is diagnosed with a physical exam and medical history. You may also have imaging tests, such as:  Chest X-ray.  CT scan.  MRI.  Bone scan.  Chest ultrasound. How  is this treated? Treatment for this condition depends on the severity of the fracture. Most rib fractures usually heal on their own in 1-3 months. Healing may take longer if you have a cough or if you do activities that make the injury worse. While you heal, you may be given medicines to control the pain. You will also be taught deep breathing exercises. Severe injuries may require hospitalization or surgery. Follow these instructions at home: Managing pain, stiffness, and swelling  If directed, put ice on the injured area. To do this: ? Put ice in a plastic bag. ? Place a towel between your skin and the bag. ? Leave the ice on for 20 minutes, 2-3 times a day. ? Remove the ice if your skin turns bright red. This is very important. If you cannot feel pain, heat, or cold, you have a greater risk of damage to the area.  Take over-the-counter and prescription medicines only as told by your health care provider. Activity  Avoid doing activities or movements that cause pain. Be careful during activities and avoid bumping the injured rib.  Slowly increase your activity as told by your health care provider. General instructions  Do deep breathing exercises as told by your health care provider. This helps prevent pneumonia, which is a common complication of a broken rib. Your health care provider may instruct you to: ? Take deep breaths several times a day. ? Try to cough several times a day, holding a pillow against the injured area. ? Use a device called incentive spirometer to practice deep breathing several times a day.  Drink enough fluid to keep your urine pale yellow.  Do not wear a rib belt or binder. These restrict breathing, which can lead to pneumonia.  Keep all follow-up visits. This is important. Contact a health care provider if:  You have a fever. Get help right away if:  You have difficulty breathing or you are short of breath.  You develop a cough that does not stop, or  you cough up thick or bloody sputum.  You have nausea, vomiting, or pain in your abdomen.  Your pain gets worse and medicine does not help. These symptoms may represent a serious problem that is an emergency. Do not wait to see if the symptoms will go away. Get medical help right away. Call your local emergency services (911 in the [place] Do not drive yourself to the hospital. Summary  A rib fracture is a break or crack in one of the bones of the ribs.  A broken or cracked  rib is often painful but is not usually serious.  Most rib fractures heal on their own over time.  Treatment for this condition depends on the severity of the fracture.  Avoid doing any activities or movements that cause pain. This information is not intended to replace advice given to you by your health care provider. Make sure you discuss any questions you have with your health care provider. Document Revised: 02/06/2020 Document Reviewed: 02/06/2020 Elsevier Patient Education  2021 [employer]

## 2020-12-07 ENCOUNTER — Other Ambulatory Visit: Payer: Self-pay

## 2020-12-07 ENCOUNTER — Ambulatory Visit (INDEPENDENT_AMBULATORY_CARE_PROVIDER_SITE_OTHER): Payer: Medicare HMO | Admitting: Family Medicine

## 2020-12-07 ENCOUNTER — Encounter: Payer: Self-pay | Admitting: Family Medicine

## 2020-12-07 VITALS — BP 124/80 | HR 60 | Ht 65.0 in | Wt 136.0 lb

## 2020-12-07 DIAGNOSIS — S2242XD Multiple fractures of ribs, left side, subsequent encounter for fracture with routine healing: Secondary | ICD-10-CM

## 2020-12-07 NOTE — Progress Notes (Signed)
Date:  12/07/2020   Name:  Connie Lee   DOB:  13-Sep-1941   MRN:  [id number]   Chief Complaint: Follow-up (Flank pain/ fx ribs)  Chest Pain  This is a new problem. The current episode started more than 1 month ago. The onset quality is gradual. The problem occurs intermittently. The problem has been gradually improving. The pain is present in the lateral region. The pain is at a severity of 1/10. The pain is mild. The pain does not radiate. Pertinent negatives include no abdominal pain, back pain, cough, dizziness, exertional chest pressure, fever, headaches, irregular heartbeat, lower extremity edema, nausea, numbness, palpitations, shortness of breath, vomiting or weakness. The treatment provided mild relief.    Lab Results  Component Value Date   CREATININE 0.97 11/12/2020   BUN 17 11/12/2020   NA 142 11/12/2020   K 4.2 11/12/2020   CL 102 11/12/2020   CO2 25 11/12/2020   No results found for: CHOL, HDL, LDLCALC, LDLDIRECT, TRIG, CHOLHDL No results found for: TSH No results found for: HGBA1C Lab Results  Component Value Date   WBC 7.2 11/12/2020   HGB 15.0 11/12/2020   HCT 45.3 11/12/2020   MCV 92 11/12/2020   PLT 192 11/12/2020   Lab Results  Component Value Date   ALT 14 11/12/2020   AST 23 11/12/2020   ALKPHOS 63 11/12/2020   BILITOT 0.4 11/12/2020     Review of Systems  Constitutional: Negative.  Negative for chills, fatigue, fever and unexpected weight change.  HENT: Negative for congestion, ear discharge, ear pain, rhinorrhea, sinus pressure, sneezing and sore throat.   Eyes: Negative for double vision, photophobia, pain, discharge, redness and itching.  Respiratory: Negative for cough, shortness of breath, wheezing and stridor.   Cardiovascular: Positive for chest pain. Negative for palpitations.  Gastrointestinal: Negative for abdominal pain, blood in stool, constipation, diarrhea, nausea and vomiting.  Endocrine: Negative for cold intolerance, heat  intolerance, polydipsia, polyphagia and polyuria.  Genitourinary: Negative for dysuria, flank pain, frequency, hematuria, menstrual problem, pelvic pain, urgency, vaginal bleeding and vaginal discharge.  Musculoskeletal: Negative for arthralgias, back pain and myalgias.  Skin: Negative for rash.  Allergic/Immunologic: Negative for environmental allergies and food allergies.  Neurological: Negative for dizziness, weakness, light-headedness, numbness and headaches.  Hematological: Negative for adenopathy. Does not bruise/bleed easily.  Psychiatric/Behavioral: Negative for dysphoric mood. The patient is not nervous/anxious.     There are no problems to display for this patient.   No Known Allergies  Past Surgical History:  Procedure Laterality Date  . ABDOMINAL HYSTERECTOMY    . APPENDECTOMY      Social History   Tobacco Use  . Smoking status: Never Smoker  . Smokeless tobacco: Never Used  Vaping Use  . Vaping Use: Never used  Substance Use Topics  . Alcohol use: Not Currently  . Drug use: Not Currently     Medication list has been reviewed and updated.  Current Meds  Medication Sig  . lidocaine (LIDODERM) 5 % Place 1 patch onto the skin daily. Remove & Discard patch within 12 hours or as directed by MD    PHQ 2/9 Scores 12/07/2020 11/12/2020  PHQ - 2 Score 0 0  PHQ- 9 Score 0 -    GAD 7 : Generalized Anxiety Score 12/07/2020  Nervous, Anxious, on Edge 0  Control/stop worrying 0  Worry too much - different things 0  Trouble relaxing 0  Restless 0  Easily annoyed or irritable 0  Afraid - awful might happen 0  Total GAD 7 Score 0    BP Readings from Last 3 Encounters:  12/07/20 124/80  11/23/20 140/80  11/15/20 (!) 159/88    Physical Exam Vitals and nursing note reviewed.  Constitutional:      Appearance: She is well-developed and well-nourished.  HENT:     Head: Normocephalic.     Right Ear: Tympanic membrane and external ear normal.     Left Ear: Tympanic  membrane and external ear normal.     Nose: Nose normal.     Mouth/Throat:     Mouth: Oropharynx is clear and moist. Mucous membranes are moist.  Eyes:     General: Lids are everted, no foreign bodies appreciated. No scleral icterus.       Left eye: No foreign body or hordeolum.     Extraocular Movements: EOM normal.     Conjunctiva/sclera: Conjunctivae normal.     Right eye: Right conjunctiva is not injected.     Left eye: Left conjunctiva is not injected.     Pupils: Pupils are equal, round, and reactive to light.  Neck:     Thyroid: No thyromegaly.     Vascular: No JVD.     Trachea: No tracheal deviation.  Cardiovascular:     Rate and Rhythm: Normal rate and regular rhythm.     Pulses: Intact distal pulses.     Heart sounds: Normal heart sounds. No murmur heard. No friction rub. No gallop.   Pulmonary:     Effort: Pulmonary effort is normal. No respiratory distress.     Breath sounds: Normal breath sounds. No stridor. No wheezing, rhonchi or rales.  Chest:     Chest wall: Tenderness present.       Comments: Tenderness left lateral 9th Abdominal:     General: Bowel sounds are normal.     Palpations: Abdomen is soft. There is no hepatosplenomegaly or mass.     Tenderness: There is no abdominal tenderness. There is no guarding or rebound.  Musculoskeletal:        General: No tenderness or edema. Normal range of motion.     Cervical back: Normal range of motion and neck supple.  Lymphadenopathy:     Cervical: No cervical adenopathy.  Skin:    General: Skin is warm.     Coloration: Skin is not jaundiced or pale.     Findings: No bruising, erythema, lesion or rash.  Neurological:     General: No focal deficit present.     Mental Status: She is alert and oriented to person, place, and time.     Cranial Nerves: No cranial nerve deficit.     Deep Tendon Reflexes: Strength normal. Reflexes normal.  Psychiatric:        Mood and Affect: Mood and affect normal. Mood is not  anxious or depressed.     Wt Readings from Last 3 Encounters:  12/07/20 136 lb (61.7 kg)  11/23/20 140 lb (63.5 kg)  11/15/20 138 lb 0.1 oz (62.6 kg)    BP 124/80   Pulse 60   Ht 5\' 5"  (1.651 m)   Wt 136 lb (61.7 kg)   BMI 22.63 kg/m    Assessment and Plan: 1. Closed fracture of multiple ribs of left side with routine healing, subsequent encounter New onset.  Gradually healing.  Stable.  Patient is gradually healing with decreasing pain decreasing tenderness and no respiratory concerns.  There is mild tenderness relative to the previous exam  and auscultation of the lungs are normal.  Patient will continue to use Tylenol as needed pain relief and will return to clinic on an as-needed basis.

## 2021-03-24 ENCOUNTER — Telehealth: Payer: Self-pay | Admitting: Family Medicine

## 2021-03-24 NOTE — Telephone Encounter (Signed)
Copied from CRM [id number]. Topic: Medicare AWV >> Mar 24, 2021 11:02 AM Claudette Laws R wrote: Reason for CRM:  Left message for patient to call back and schedule Medicare Annual Wellness Visit (AWV) in office.   If unable to come into the office for AWV,  please offer to do virtually or by telephone.  No hx of AWV eligible for AWVI as of 10/30/2009  Please schedule at anytime with [facility] Health Advisor.      40 Minutes appointment   Any questions, please call me at [phone]

## 2021-06-13 ENCOUNTER — Telehealth: Payer: Self-pay | Admitting: Family Medicine

## 2021-06-13 NOTE — Telephone Encounter (Signed)
Patient requesting a call back from nurse, best # [phone]

## 2021-06-13 NOTE — Telephone Encounter (Signed)
Copied from CRM [id number]. Topic: Medicare AWV >> Jun 13, 2021 11:56 AM Claudette Laws R wrote: Reason for CRM:  Left message for patient to call back and schedule Medicare Annual Wellness Visit (AWV) in office.   If unable to come into the office for AWV,  please offer to do virtually or by telephone.  No hx of AWV eligible for AWVI as of  10/30/2009  Please schedule at anytime with [facility] Health Advisor.      40 Minutes appointment   Any questions, please call me at [phone]

## 2021-06-22 ENCOUNTER — Ambulatory Visit (INDEPENDENT_AMBULATORY_CARE_PROVIDER_SITE_OTHER): Payer: Medicare HMO

## 2021-06-22 DIAGNOSIS — Z0000 Encounter for general adult medical examination without abnormal findings: Secondary | ICD-10-CM

## 2021-06-22 NOTE — Patient Instructions (Signed)
Connie Lee , Thank you for taking time to come for your Medicare Wellness Visit. I appreciate your ongoing commitment to your health goals. Please review the following plan we discussed and let me know if I can assist you in the future.   Screening recommendations/referrals: Colonoscopy: no longer required Mammogram: no longer required Bone Density: no longer required Recommended yearly ophthalmology/optometry visit for glaucoma screening and checkup Recommended yearly dental visit for hygiene and checkup  Vaccinations: Influenza vaccine: declined Pneumococcal vaccine: declined Tdap vaccine: due Shingles vaccine: Shingrix discussed. Please contact your pharmacy for coverage information.  Covid-19: declined  Advanced directives: Advance directive discussed with you today. I have provided a copy for you to complete at home and have notarized. Once this is complete please bring a copy in to our office so we can scan it into your chart.   Conditions/risks identified: Keep up the great work!  Next appointment: Follow up in one year for your annual wellness visit    Preventive Care 65 Years and Older, Female Preventive care refers to lifestyle choices and visits with your health care provider that can promote health and wellness. What does preventive care include? A yearly physical exam. This is also called an annual well check. Dental exams once or twice a year. Routine eye exams. Ask your health care provider how often you should have your eyes checked. Personal lifestyle choices, including: Daily care of your teeth and gums. Regular physical activity. Eating a healthy diet. Avoiding tobacco and drug use. Limiting alcohol use. Practicing safe sex. Taking low-dose aspirin every day. Taking vitamin and mineral supplements as recommended by your health care provider. What happens during an annual well check? The services and screenings done by your health care provider during your  annual well check will depend on your age, overall health, lifestyle risk factors, and family history of disease. Counseling  Your health care provider may ask you questions about your: Alcohol use. Tobacco use. Drug use. Emotional well-being. Home and relationship well-being. Sexual activity. Eating habits. History of falls. Memory and ability to understand (cognition). Work and work Astronomer. Reproductive health. Screening  You may have the following tests or measurements: Height, weight, and BMI. Blood pressure. Lipid and cholesterol levels. These may be checked every 5 years, or more frequently if you are over 81 years old. Skin check. Lung cancer screening. You may have this screening every year starting at age 36 if you have a 30-pack-year history of smoking and currently smoke or have quit within the past 15 years. Fecal occult blood test (FOBT) of the stool. You may have this test every year starting at age 16. Flexible sigmoidoscopy or colonoscopy. You may have a sigmoidoscopy every 5 years or a colonoscopy every 10 years starting at age 50. Hepatitis C blood test. Hepatitis B blood test. Sexually transmitted disease (STD) testing. Diabetes screening. This is done by checking your blood sugar (glucose) after you have not eaten for a while (fasting). You may have this done every 1-3 years. Bone density scan. This is done to screen for osteoporosis. You may have this done starting at age 40. Mammogram. This may be done every 1-2 years. Talk to your health care provider about how often you should have regular mammograms. Talk with your health care provider about your test results, treatment options, and if necessary, the need for more tests. Vaccines  Your health care provider may recommend certain vaccines, such as: Influenza vaccine. This is recommended every year. Tetanus, diphtheria, and  acellular pertussis (Tdap, Td) vaccine. You may need a Td booster every 10  years. Zoster vaccine. You may need this after age 30. Pneumococcal 13-valent conjugate (PCV13) vaccine. One dose is recommended after age 82. Pneumococcal polysaccharide (PPSV23) vaccine. One dose is recommended after age 45. Talk to your health care provider about which screenings and vaccines you need and how often you need them. This information is not intended to replace advice given to you by your health care provider. Make sure you discuss any questions you have with your health care provider. Document Released: 11/12/2015 Document Revised: 07/05/2016 Document Reviewed: 08/17/2015 Elsevier Interactive Patient Education  2017 [employer] Prevention in the Home Falls can cause injuries. They can happen to people of all ages. There are many things you can do to make your home safe and to help prevent falls. What can I do on the outside of my home? Regularly fix the edges of walkways and driveways and fix any cracks. Remove anything that might make you trip as you walk through a door, such as a raised step or threshold. Trim any bushes or trees on the path to your home. Use bright outdoor lighting. Clear any walking paths of anything that might make someone trip, such as rocks or tools. Regularly check to see if handrails are loose or broken. Make sure that both sides of any steps have handrails. Any raised decks and porches should have guardrails on the edges. Have any leaves, snow, or ice cleared regularly. Use sand or salt on walking paths during winter. Clean up any spills in your garage right away. This includes oil or grease spills. What can I do in the bathroom? Use night lights. Install grab bars by the toilet and in the tub and shower. Do not use towel bars as grab bars. Use non-skid mats or decals in the tub or shower. If you need to sit down in the shower, use a plastic, non-slip stool. Keep the floor dry. Clean up any water that spills on the floor as soon as it  happens. Remove soap buildup in the tub or shower regularly. Attach bath mats securely with double-sided non-slip rug tape. Do not have throw rugs and other things on the floor that can make you trip. What can I do in the bedroom? Use night lights. Make sure that you have a light by your bed that is easy to reach. Do not use any sheets or blankets that are too big for your bed. They should not hang down onto the floor. Have a firm chair that has side arms. You can use this for support while you get dressed. Do not have throw rugs and other things on the floor that can make you trip. What can I do in the kitchen? Clean up any spills right away. Avoid walking on wet floors. Keep items that you use a lot in easy-to-reach places. If you need to reach something above you, use a strong step stool that has a grab bar. Keep electrical cords out of the way. Do not use floor polish or wax that makes floors slippery. If you must use wax, use non-skid floor wax. Do not have throw rugs and other things on the floor that can make you trip. What can I do with my stairs? Do not leave any items on the stairs. Make sure that there are handrails on both sides of the stairs and use them. Fix handrails that are broken or loose. Make sure that  handrails are as long as the stairways. Check any carpeting to make sure that it is firmly attached to the stairs. Fix any carpet that is loose or worn. Avoid having throw rugs at the top or bottom of the stairs. If you do have throw rugs, attach them to the floor with carpet tape. Make sure that you have a light switch at the top of the stairs and the bottom of the stairs. If you do not have them, ask someone to add them for you. What else can I do to help prevent falls? Wear shoes that: Do not have high heels. Have rubber bottoms. Are comfortable and fit you well. Are closed at the toe. Do not wear sandals. If you use a stepladder: Make sure that it is fully opened.  Do not climb a closed stepladder. Make sure that both sides of the stepladder are locked into place. Ask someone to hold it for you, if possible. Clearly mark and make sure that you can see: Any grab bars or handrails. First and last steps. Where the edge of each step is. Use tools that help you move around (mobility aids) if they are needed. These include: Canes. Walkers. Scooters. Crutches. Turn on the lights when you go into a dark area. Replace any light bulbs as soon as they burn out. Set up your furniture so you have a clear path. Avoid moving your furniture around. If any of your floors are uneven, fix them. If there are any pets around you, be aware of where they are. Review your medicines with your doctor. Some medicines can make you feel dizzy. This can increase your chance of falling. Ask your doctor what other things that you can do to help prevent falls. This information is not intended to replace advice given to you by your health care provider. Make sure you discuss any questions you have with your health care provider. Document Released: 08/12/2009 Document Revised: 03/23/2016 Document Reviewed: 11/20/2014 Elsevier Interactive Patient Education  2017 [employer].

## 2021-06-22 NOTE — Progress Notes (Signed)
Subjective:   Connie McgregorShirley J Strayer is a 80 y.o. female who presents for an Initial Medicare Annual Wellness Visit.  Virtual Visit via Telephone Note  I connected with  Connie Lee on 06/22/21 at  8:00 AM EDT by telephone and verified that I am speaking with the correct person using two identifiers.  Location: Patient: home [facility] Persons participating in the virtual visit: patient/Nurse Health Advisor   I discussed the limitations, risks, security and privacy concerns of performing an evaluation and management service by telephone and the availability of in person appointments. The patient expressed understanding and agreed to proceed.  Interactive audio and video telecommunications were attempted between this nurse and patient, however failed, due to patient having technical difficulties OR patient did not have access to video capability.  We continued and completed visit with audio only.  Some vital signs may be absent or patient reported.   Reather LittlerKasey Raydel Hosick, LPN   Review of Systems     Cardiac Risk Factors include: advanced age (>6455men, 24>65 women)     Objective:    There were no vitals filed for this visit. There is no height or weight on file to calculate BMI.  Advanced Directives 06/22/2021 11/15/2020 06/17/2019  Does Patient Have a Medical Advance Directive? No No No  Would patient like information on creating a medical advance directive? Yes (MAU/Ambulatory/Procedural Areas - Information given) - No - Patient declined    Current Medications (verified) Outpatient Encounter Medications as of 06/22/2021  Medication Sig   [DISCONTINUED] ibuprofen (MOTRIN IB) 200 MG tablet Take 3 tablets (600 mg total) by mouth every 6 (six) hours as needed. (Patient not taking: Reported on 12/07/2020)   [DISCONTINUED] lidocaine (LIDODERM) 5 % Place 1 patch onto the skin daily. Remove & Discard patch within 12 hours or as directed by MD   No facility-administered encounter  medications on file as of 06/22/2021.    Allergies (verified) Patient has no known allergies.   History: History reviewed. No pertinent past medical history. Past Surgical History:  Procedure Laterality Date   ABDOMINAL HYSTERECTOMY     APPENDECTOMY     History reviewed. No pertinent family history. Social History   Socioeconomic History   Marital status: Widowed    Spouse name: Not on file   Number of children: 3   Years of education: Not on file   Highest education level: Not on file  Occupational History   Not on file  Tobacco Use   Smoking status: Never   Smokeless tobacco: Never  Vaping Use   Vaping Use: Never used  Substance and Sexual Activity   Alcohol use: Not Currently   Drug use: Not Currently   Sexual activity: Not on file  Other Topics Concern   Not on file  Social History Narrative   Not on file   Social Determinants of Health   Financial Resource Strain: Low Risk    Difficulty of Paying Living Expenses: Not hard at all  Food Insecurity: No Food Insecurity   Worried About Running Out of Food in the Last Year: Never true   Ran Out of Food in the Last Year: Never true  Transportation Needs: No Transportation Needs   Lack of Transportation (Medical): No   Lack of Transportation (Non-Medical): No  Physical Activity: Inactive   Days of Exercise per Week: 0 days   Minutes of Exercise per Session: 0 min  Stress: No Stress Concern Present   Feeling of Stress : Not at all  Social Connections: Moderately Isolated   Frequency of Communication with Friends and Family: More than three times a week   Frequency of Social Gatherings with Friends and Family: More than three times a week   Attends Religious Services: More than 4 times per year   Active Member of [employer] or Organizations: No   Attends Banker Meetings: Never   Marital Status: Widowed    Tobacco Counseling Counseling given: Not Answered   Clinical Intake:  Pre-visit preparation  completed: Yes  Pain : No/denies pain     Nutritional Risks: None Diabetes: No  How often do you need to have someone help you when you read instructions, pamphlets, or other written materials from your doctor or pharmacy?: 1 - Never   Interpreter Needed?: No  Information entered by :: Reather Littler LPN   Activities of Daily Living In your present state of health, do you have any difficulty performing the following activities: 06/22/2021 11/12/2020  Hearing? N N  Vision? Y N  Difficulty concentrating or making decisions? N N  Walking or climbing stairs? N N  Dressing or bathing? N N  Doing errands, shopping? N N  Preparing Food and eating ? N -  Using the Toilet? N -  In the past six months, have you accidently leaked urine? N -  Do you have problems with loss of bowel control? N -  Managing your Medications? N -  Managing your Finances? N -  Housekeeping or managing your Housekeeping? N -  Some recent data might be hidden    Patient Care Team: Duanne Limerick, MD as PCP - General (Family Medicine)  Indicate any recent Medical Services you may have received from other than [facility] providers in the past year (date may be approximate).     Assessment:   This is a routine wellness examination for [place].  Hearing/Vision screen Hearing Screening - Comments:: Pt denies hearing difficulty Vision Screening - Comments:: Annual vision screenings done by Dr. Clydene Pugh  Dietary issues and exercise activities discussed: Current Exercise Habits: The patient does not participate in regular exercise at present, Exercise limited by: None identified   Goals Addressed   None    Depression Screen PHQ 2/9 Scores 06/22/2021 12/07/2020 11/12/2020  PHQ - 2 Score 0 0 0  PHQ- 9 Score - 0 -    Fall Risk Fall Risk  06/22/2021  Falls in the past year? 1  Number falls in past yr: 0  Injury with Fall? 1  Risk for fall due to : History of fall(s)  Follow up Falls prevention discussed    FALL  RISK PREVENTION PERTAINING TO THE HOME:  Any stairs in or around the home? Yes  If so, are there any without handrails? Yes  - few outside steps Home free of loose throw rugs in walkways, pet beds, electrical cords, etc? Yes  Adequate lighting in your home to reduce risk of falls? Yes   ASSISTIVE DEVICES UTILIZED TO PREVENT FALLS:  Life alert? No  Use of a cane, walker or w/c? No  Grab bars in the bathroom? No  Shower chair or bench in shower? Yes  Elevated toilet seat or a handicapped toilet? Yes   TIMED UP AND GO:  Was the test performed? No . Telephonic visit.   Cognitive Function: Normal cognitive status assessed by direct observation by this Nurse Health Advisor. No abnormalities found.          Immunizations  There is no immunization history on  file for this patient.  TDAP status: Due, Education has been provided regarding the importance of this vaccine. Advised may receive this vaccine at local pharmacy or Health Dept. Aware to provide a copy of the vaccination record if obtained from local pharmacy or Health Dept. Verbalized acceptance and understanding.  Flu Vaccine status: Declined, Education has been provided regarding the importance of this vaccine but patient still declined. Advised may receive this vaccine at local pharmacy or Health Dept. Aware to provide a copy of the vaccination record if obtained from local pharmacy or Health Dept. Verbalized acceptance and understanding.  Pneumococcal vaccine status: Declined,  Education has been provided regarding the importance of this vaccine but patient still declined. Advised may receive this vaccine at local pharmacy or Health Dept. Aware to provide a copy of the vaccination record if obtained from local pharmacy or Health Dept. Verbalized acceptance and understanding.   Covid-19 vaccine status: Declined, Education has been provided regarding the importance of this vaccine but patient still declined. Advised may receive  this vaccine at local pharmacy or Health Dept.or vaccine clinic. Aware to provide a copy of the vaccination record if obtained from local pharmacy or Health Dept. Verbalized acceptance and understanding.  Qualifies for Shingles Vaccine? Yes   Zostavax completed No   Shingrix Completed?: No.    Education has been provided regarding the importance of this vaccine. Patient has been advised to call insurance company to determine out of pocket expense if they have not yet received this vaccine. Advised may also receive vaccine at local pharmacy or Health Dept. Verbalized acceptance and understanding.  Screening Tests Health Maintenance  Topic Date Due   COVID-19 Vaccine (1) Never done   Zoster Vaccines- Shingrix (1 of 2) Never done   INFLUENZA VACCINE  05/30/2021   DEXA SCAN  11/12/2021 (Originally 03/03/2005)   TETANUS/TDAP  11/12/2021 (Originally 03/04/1960)   PNA vac Low Risk Adult (1 of 2 - PCV13) 11/12/2021 (Originally 03/03/2005)   HPV VACCINES  Aged Out    Health Maintenance  Health Maintenance Due  Topic Date Due   COVID-19 Vaccine (1) Never done   Zoster Vaccines- Shingrix (1 of 2) Never done   INFLUENZA VACCINE  05/30/2021    Colorectal cancer screening: No longer required.   Mammogram status: No longer required due to age.  Bone density status: no longer required due to age  Lung Cancer Screening: (Low Dose CT Chest recommended if Age 25-80 years, 30 pack-year currently smoking OR have quit w/in 15years.) does not qualify.   Additional Screening:  Hepatitis C Screening: does not qualify  Vision Screening: Recommended annual ophthalmology exams for early detection of glaucoma and other disorders of the eye. Is the patient up to date with their annual eye exam?  Yes  Who is the provider or what is the name of the office in which the patient attends annual eye exams? [facility].   Dental Screening: Recommended annual dental exams for proper oral hygiene  Community  Resource Referral / Chronic Care Management: CRR required this visit?  No   CCM required this visit?  No      Plan:     I have personally reviewed and noted the following in the patient's chart:   Medical and social history Use of alcohol, tobacco or illicit drugs  Current medications and supplements including opioid prescriptions. Patient is not currently taking opioid prescriptions. Functional ability and status Nutritional status Physical activity Advanced directives List of other physicians Hospitalizations, surgeries,  and ER visits in previous 12 months Vitals Screenings to include cognitive, depression, and falls Referrals and appointments  In addition, I have reviewed and discussed with patient certain preventive protocols, quality metrics, and best practice recommendations. A written personalized care plan for preventive services as well as general preventive health recommendations were provided to patient.     Reather Littler, LPN   [id number]   Nurse Notes: none

## 2021-10-02 ENCOUNTER — Emergency Department: Payer: Medicare HMO

## 2021-10-02 ENCOUNTER — Encounter: Payer: Self-pay | Admitting: Emergency Medicine

## 2021-10-02 ENCOUNTER — Other Ambulatory Visit: Payer: Self-pay

## 2021-10-02 ENCOUNTER — Inpatient Hospital Stay
Admission: EM | Admit: 2021-10-02 | Discharge: 2021-10-06 | DRG: 522 | Disposition: A | Discharge status: Home / Self Care | Payer: Medicare HMO | Attending: Internal Medicine | Admitting: Internal Medicine

## 2021-10-02 DIAGNOSIS — S72002A Fracture of unspecified part of neck of left femur, initial encounter for closed fracture: Secondary | ICD-10-CM | POA: Diagnosis not present

## 2021-10-02 DIAGNOSIS — M8580 Other specified disorders of bone density and structure, unspecified site: Secondary | ICD-10-CM | POA: Diagnosis not present

## 2021-10-02 DIAGNOSIS — W010XXA Fall on same level from slipping, tripping and stumbling without subsequent striking against object, initial encounter: Secondary | ICD-10-CM | POA: Diagnosis present

## 2021-10-02 DIAGNOSIS — S72009A Fracture of unspecified part of neck of unspecified femur, initial encounter for closed fracture: Secondary | ICD-10-CM

## 2021-10-02 DIAGNOSIS — R110 Nausea: Secondary | ICD-10-CM | POA: Diagnosis not present

## 2021-10-02 DIAGNOSIS — S72012A Unspecified intracapsular fracture of left femur, initial encounter for closed fracture: Secondary | ICD-10-CM | POA: Diagnosis not present

## 2021-10-02 DIAGNOSIS — D509 Iron deficiency anemia, unspecified: Secondary | ICD-10-CM | POA: Diagnosis present

## 2021-10-02 DIAGNOSIS — R079 Chest pain, unspecified: Secondary | ICD-10-CM | POA: Diagnosis not present

## 2021-10-02 DIAGNOSIS — Y92009 Unspecified place in unspecified non-institutional (private) residence as the place of occurrence of the external cause: Secondary | ICD-10-CM

## 2021-10-02 DIAGNOSIS — I10 Essential (primary) hypertension: Secondary | ICD-10-CM | POA: Diagnosis not present

## 2021-10-02 DIAGNOSIS — Z79899 Other long term (current) drug therapy: Secondary | ICD-10-CM

## 2021-10-02 DIAGNOSIS — Z20822 Contact with and (suspected) exposure to covid-19: Secondary | ICD-10-CM | POA: Diagnosis not present

## 2021-10-02 DIAGNOSIS — W19XXXA Unspecified fall, initial encounter: Secondary | ICD-10-CM

## 2021-10-02 DIAGNOSIS — Z96642 Presence of left artificial hip joint: Secondary | ICD-10-CM | POA: Diagnosis not present

## 2021-10-02 DIAGNOSIS — Z471 Aftercare following joint replacement surgery: Secondary | ICD-10-CM | POA: Diagnosis not present

## 2021-10-02 DIAGNOSIS — M25552 Pain in left hip: Secondary | ICD-10-CM | POA: Diagnosis not present

## 2021-10-02 DIAGNOSIS — D72829 Elevated white blood cell count, unspecified: Secondary | ICD-10-CM | POA: Diagnosis present

## 2021-10-02 DIAGNOSIS — R52 Pain, unspecified: Secondary | ICD-10-CM | POA: Diagnosis not present

## 2021-10-02 DIAGNOSIS — Z8781 Personal history of (healed) traumatic fracture: Secondary | ICD-10-CM | POA: Diagnosis present

## 2021-10-02 DIAGNOSIS — D696 Thrombocytopenia, unspecified: Secondary | ICD-10-CM | POA: Diagnosis not present

## 2021-10-02 DIAGNOSIS — D62 Acute posthemorrhagic anemia: Secondary | ICD-10-CM | POA: Diagnosis not present

## 2021-10-02 DIAGNOSIS — Z01811 Encounter for preprocedural respiratory examination: Secondary | ICD-10-CM

## 2021-10-02 LAB — BASIC METABOLIC PANEL
Anion gap: 10 (ref 5–15)
BUN: 15 mg/dL (ref 8–23)
CO2: 24 mmol/L (ref 22–32)
Calcium: 9.3 mg/dL (ref 8.9–10.3)
Chloride: 104 mmol/L (ref 98–111)
Creatinine, Ser: 0.83 mg/dL (ref 0.44–1.00)
GFR, Estimated: >60 mL/min (ref >60)
Glucose, Bld: 125 mg/dL — ABNORMAL HIGH (ref 70–99)
Potassium: 3.5 mmol/L (ref 3.5–5.1)
Sodium: 138 mmol/L (ref 135–145)

## 2021-10-02 LAB — CBC WITH DIFFERENTIAL/PLATELET
Abs Immature Granulocytes: 0.04 10*3/uL (ref 0.00–0.07)
Basophils Absolute: 0 10*3/uL (ref 0.0–0.1)
Basophils Relative: 0 %
Eosinophils Absolute: 0 10*3/uL (ref 0.0–0.5)
Eosinophils Relative: 0 %
HCT: 41.8 % (ref 36.0–46.0)
Hemoglobin: 14.1 g/dL (ref 12.0–15.0)
Immature Granulocytes: 0 %
Lymphocytes Relative: 9 %
Lymphs Abs: 1 10*3/uL (ref 0.7–4.0)
MCH: 31.1 pg (ref 26.0–34.0)
MCHC: 33.7 g/dL (ref 30.0–36.0)
MCV: 92.3 fL (ref 80.0–100.0)
Monocytes Absolute: 0.5 10*3/uL (ref 0.1–1.0)
Monocytes Relative: 5 %
Neutro Abs: 9.5 10*3/uL — ABNORMAL HIGH (ref 1.7–7.7)
Neutrophils Relative %: 86 %
Platelets: 141 10*3/uL — ABNORMAL LOW (ref 150–400)
RBC: 4.53 MIL/uL (ref 3.87–5.11)
RDW: 12.7 % (ref 11.5–15.5)
WBC: 11.2 10*3/uL — ABNORMAL HIGH (ref 4.0–10.5)
nRBC: 0 % (ref 0.0–0.2)

## 2021-10-02 LAB — RESP PANEL BY RT-PCR (FLU A&B, COVID) ARPGX2
Influenza A by PCR: NEGATIVE
Influenza B by PCR: NEGATIVE
SARS Coronavirus 2 by RT PCR: NEGATIVE

## 2021-10-02 LAB — PROTIME-INR
INR: 1.1 (ref 0.8–1.2)
Prothrombin Time: 13.8 seconds (ref 11.4–15.2)

## 2021-10-02 LAB — MAGNESIUM: Magnesium: 2.1 mg/dL (ref 1.7–2.4)

## 2021-10-02 MED ORDER — SODIUM CHLORIDE 0.9 % IV BOLUS
500.0000 mL | Freq: Once | INTRAVENOUS | Status: AC
  Administered 2021-10-02: 20:00:00 500 mL via INTRAVENOUS

## 2021-10-02 MED ORDER — ONDANSETRON HCL 4 MG PO TABS: 4.0000 mg | ORAL_TABLET | Freq: Four times a day (QID) | ORAL | Status: DC | PRN

## 2021-10-02 MED ORDER — ACETAMINOPHEN 650 MG RE SUPP: 650.0000 mg | Freq: Four times a day (QID) | RECTAL | Status: DC | PRN

## 2021-10-02 MED ORDER — MAGNESIUM HYDROXIDE 400 MG/5ML PO SUSP
30.0000 mL | Freq: Every day | ORAL | Status: DC | PRN
  Administered 2021-10-04 – 2021-10-05 (×2): 30 mL via ORAL
  Filled 2021-10-02 (×2): qty 30

## 2021-10-02 MED ORDER — MORPHINE SULFATE (PF) 2 MG/ML IV SOLN
2.0000 mg | INTRAVENOUS | Status: DC | PRN
  Administered 2021-10-03: 1 mg via INTRAVENOUS
  Filled 2021-10-02: qty 1

## 2021-10-02 MED ORDER — POTASSIUM CHLORIDE 20 MEQ PO PACK
40.0000 meq | PACK | Freq: Once | ORAL | Status: AC
  Administered 2021-10-02: 22:00:00 40 meq via ORAL
  Filled 2021-10-02: qty 2

## 2021-10-02 MED ORDER — ACETAMINOPHEN 325 MG PO TABS: 650.0000 mg | ORAL_TABLET | Freq: Four times a day (QID) | ORAL | Status: DC | PRN

## 2021-10-02 MED ORDER — OXYCODONE-ACETAMINOPHEN 5-325 MG PO TABS
1.0000 | ORAL_TABLET | ORAL | Status: DC | PRN
  Administered 2021-10-03: 2 via ORAL
  Filled 2021-10-02: qty 2

## 2021-10-02 MED ORDER — MORPHINE SULFATE (PF) 4 MG/ML IV SOLN
4.0000 mg | Freq: Once | INTRAVENOUS | Status: AC
  Administered 2021-10-02: 19:00:00 4 mg via INTRAVENOUS
  Filled 2021-10-02: qty 1

## 2021-10-02 MED ORDER — TRAZODONE HCL 50 MG PO TABS
25.0000 mg | ORAL_TABLET | Freq: Every evening | ORAL | Status: DC | PRN
  Administered 2021-10-05: 25 mg via ORAL
  Filled 2021-10-02: qty 1

## 2021-10-02 MED ORDER — ONDANSETRON HCL 4 MG/2ML IJ SOLN
4.0000 mg | Freq: Four times a day (QID) | INTRAMUSCULAR | Status: DC | PRN
  Filled 2021-10-02: qty 2

## 2021-10-02 MED ORDER — HYDROMORPHONE HCL 1 MG/ML IJ SOLN
1.0000 mg | Freq: Once | INTRAMUSCULAR | Status: AC
  Administered 2021-10-02: 21:00:00 1 mg via INTRAVENOUS
  Filled 2021-10-02: qty 1

## 2021-10-02 MED ORDER — SODIUM CHLORIDE 0.9 % IV SOLN: INTRAVENOUS | Status: DC

## 2021-10-02 MED ORDER — ONDANSETRON HCL 4 MG/2ML IJ SOLN
4.0000 mg | Freq: Once | INTRAMUSCULAR | Status: AC
  Administered 2021-10-02: 19:00:00 4 mg via INTRAVENOUS
  Filled 2021-10-02: qty 2

## 2021-10-02 MED ORDER — ENOXAPARIN SODIUM 40 MG/0.4ML IJ SOSY: 40.0000 mg | PREFILLED_SYRINGE | INTRAMUSCULAR | Status: DC

## 2021-10-02 NOTE — ED Provider Notes (Signed)
[facility] Emergency Department Provider Note  ____________________________________________  Time seen: Approximately 7:27 PM  I have reviewed the triage vital signs and the nursing notes.   HISTORY  Chief Complaint Fall and Hip Pain    HPI Connie Lee is a 80 y.o. 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  She had sudden severe pain in the area, unable to stand up or bear weight.  Pain is worse with movement, nonradiating, no alleviating factors.  No head injury or other injuries.  Denies any other pain.    History reviewed. No pertinent past medical history.   Patient Active Problem List   Diagnosis Date Noted   Closed left hip fracture (HCC) 10/02/2021     Past Surgical History:  Procedure Laterality Date   ABDOMINAL HYSTERECTOMY     APPENDECTOMY       Prior to Admission medications   Not on File     Allergies Patient has no known allergies.   No family history on file.  Social History Social History   Tobacco Use   Smoking status: Never   Smokeless tobacco: Never  Vaping Use   Vaping Use: Never used  Substance Use Topics   Alcohol use: Not Currently   Drug use: Not Currently    Review of Systems  Constitutional:   No fever or chills.  ENT:   No sore throat. No rhinorrhea. Cardiovascular:   No chest pain or syncope. Respiratory:   No dyspnea or cough. Gastrointestinal:   Negative for abdominal pain, vomiting and diarrhea.  Musculoskeletal:   Left hip pain as above All other systems reviewed and are negative except as documented above in ROS and HPI.  ____________________________________________   PHYSICAL EXAM:  VITAL SIGNS: ED Triage Vitals  Enc Vitals Group     BP 10/02/21 1749 (!) 170/74     Pulse Rate 10/02/21 1749 79     Resp  10/02/21 1749 17     Temp 10/02/21 1749 97.6 F (36.4 C)     Temp Source 10/02/21 1749 Oral     SpO2 10/02/21 1749 98 %     Weight 10/02/21 1753 147 lb 4.3 oz (66.8 kg)     Height 10/02/21 1753 5\' 5"  (1.651 m)     Head Circumference --      Peak Flow --      Pain Score 10/02/21 1752 4     Pain Loc --      Pain Edu? --      Excl. in GC? --     Vital signs reviewed, nursing assessments reviewed.   Constitutional:   Alert and oriented. Non-toxic appearance. Eyes:   Conjunctivae are normal. EOMI. PERRL. ENT      Head:   Normocephalic and atraumatic.      Nose:   Wearing a mask.      Mouth/Throat:   Wearing a mask.      Neck:   No meningismus. Full ROM. Hematological/Lymphatic/Immunilogical:   No cervical lymphadenopathy. Cardiovascular:   RRR. Symmetric bilateral radial and DP pulses.  No murmurs. Cap refill less than 2 seconds.  Left foot is warm and well-perfused. Respiratory:   Normal respiratory effort without tachypnea/retractions. Breath sounds are clear and equal bilaterally. No wheezes/rales/rhonchi. Gastrointestinal:   Soft and nontender. Non distended. There  is no CVA tenderness.  No rebound, rigidity, or guarding. Genitourinary:   deferred Musculoskeletal:   Tenderness at the left hip.  There is shortening of the left lower extremity. Neurologic:   Normal speech and language.  Motor grossly intact.  Sensation intact bilateral lower extremities. No acute focal neurologic deficits are appreciated.  Skin:    Skin is warm, dry and intact. No rash noted.  No petechiae, purpura, or bullae.  ____________________________________________    LABS (pertinent positives/negatives) (all labs ordered are listed, but only abnormal results are displayed) Labs Reviewed  RESP PANEL BY RT-PCR (FLU A&B, COVID) ARPGX2  BASIC METABOLIC PANEL  CBC WITH DIFFERENTIAL/PLATELET  PROTIME-INR  TYPE AND SCREEN    ____________________________________________   EKG    ____________________________________________    RADIOLOGY  No results found.  ____________________________________________   PROCEDURES Procedures  ____________________________________________    CLINICAL IMPRESSION / ASSESSMENT AND PLAN / ED COURSE  Medications ordered in the ED: Medications  morphine 4 MG/ML injection 4 mg (has no administration in time range)  sodium chloride 0.9 % bolus 500 mL (has no administration in time range)  ondansetron (ZOFRAN) injection 4 mg (has no administration in time range)    Pertinent labs & imaging results that were available during my care of the patient were reviewed by me and considered in my medical decision making (see chart for details).  Connie Lee was evaluated in Emergency Department on 10/02/2021 for the symptoms described in the history of present illness. She was evaluated in the context of the global COVID-19 pandemic, which necessitated consideration that the patient might be at risk for infection with the SARS-CoV-2 virus that causes COVID-19. Institutional protocols and algorithms that pertain to the evaluation of patients at risk for COVID-19 are in a state of rapid change based on information released by regulatory bodies including the CDC and federal and state organizations. These policies and algorithms were followed during the patient's care in the ED.   Patient presents with left hip pain after a fall.  X-ray viewed and interpreted by me, shows left subcapital hip fracture.  Case discussed with ortho Dr. Martha Clan who will plan surgical repair tomorrow.  Discussed with hospitalist for further management.      ____________________________________________   FINAL CLINICAL IMPRESSION(S) / ED DIAGNOSES    Final diagnoses:  Closed left hip fracture, initial encounter [facility])     ED Discharge Orders     None       Portions of this note were  generated with dragon dictation software. Dictation errors may occur despite best attempts at proofreading.    Sharman Cheek, MD 10/02/21 1929

## 2021-10-02 NOTE — ED Notes (Signed)
Patient transported to X-ray 

## 2021-10-02 NOTE — ED Triage Notes (Signed)
Pt in via [facility] from home, reports mechanical fall, being knocked back by a gate, landing on left hip.  Pt unable to fully extend left leg due to pain.  Denies hitting head, denies LOC.  A/Ox4, NAD noted at this time.

## 2021-10-02 NOTE — H&P (Signed)
[place]   PATIENT NAME: Connie Lee    MR#:  [id number]  DATE OF BIRTH:  May 25, 1941  DATE OF ADMISSION:  10/02/2021  PRIMARY CARE PHYSICIAN: Duanne Limerick, MD   Patient is coming from: Home  REQUESTING/REFERRING PHYSICIAN: Alfonse Flavors, MD  CHIEF COMPLAINT:   Chief Complaint  Patient presents with   Fall   Hip Pain    HISTORY OF PRESENT ILLNESS:  Connie Lee is a 80 y.o. Caucasian female with no significant medical history, who presented to the ER with acute onset of left hip pain after an accidental mechanical fall in her forearm while putting her animals away when a pig knocked her over causing her to fall on her left side.  She had acute sudden severe pain in her left hip which has been worse with movement.  No paresthesias or focal muscle weakness.  She denies any headache or dizziness or blurred vision.  No presyncope or syncope.  No chest pain or palpitations.  No cough or wheezing or dyspnea.  No bleeding diathesis.  ED Course: Upon position to the emergency room, blood pressure was 170/74 with otherwise normal vital signs.  Labs revealed borderline potassium of 3.5 with otherwise unremarkable BMP.  CBC showed WBC of 11.2 and mild thrombocytopenia of 141. Respiratory panel is currently pending.  EKG as reviewed by me : Pending Imaging:   Chest x-ray showed mild pleural-based apical density on the right and may be scarring without ability to rule out focal lesion.  Left hip x-ray showed acute displaced left subcapital femoral neck fracture with 15 mm superior displacement.  The patient was given 4 mg of morphine sulfate and 4 mg IV Zofran as well as 500 mill IV normal saline bolus.  She will be admitted to a medical-surgical bed for further evaluation and management. PAST MEDICAL HISTORY:  History reviewed. No pertinent past medical history.  She denies any chronic medical problems.  PAST SURGICAL HISTORY:   Past Surgical History:  Procedure  Laterality Date   ABDOMINAL HYSTERECTOMY     APPENDECTOMY    -Left ovarian cystectomy  SOCIAL HISTORY:   Social History   Tobacco Use   Smoking status: Never   Smokeless tobacco: Never  Substance Use Topics   Alcohol use: Not Currently    FAMILY HISTORY:  Her Father died from small cell lung cancer  DRUG ALLERGIES:  No Known Allergies  REVIEW OF SYSTEMS:   ROS As per history of present illness. All pertinent systems were reviewed above. Constitutional, HEENT, cardiovascular, respiratory, GI, GU, musculoskeletal, neuro, psychiatric, endocrine, integumentary and hematologic systems were reviewed and are otherwise negative/unremarkable except for positive findings mentioned above in the HPI.   MEDICATIONS AT HOME:   Prior to Admission medications   Not on File      VITAL SIGNS:  Blood pressure (!) 150/65, pulse 85, temperature 97.6 F (36.4 C), temperature source Oral, resp. rate 18, height 5\' 5"  (1.651 m), weight 66.8 kg, SpO2 95 %.  PHYSICAL EXAMINATION:  Physical Exam  GENERAL:  80 y.o.-year-old Caucasian female patient lying in the bed with no acute distress.  EYES: Pupils equal, round, reactive to light and accommodation. No scleral icterus. Extraocular muscles intact.  HEENT: Head atraumatic, normocephalic. Oropharynx and nasopharynx clear.  NECK:  Supple, no jugular venous distention. No thyroid enlargement, no tenderness.  LUNGS: Normal breath sounds bilaterally, no wheezing, rales,rhonchi or crepitation. No use of accessory muscles of respiration.  CARDIOVASCULAR: Regular rate and  rhythm, S1, S2 normal. No murmurs, rubs, or gallops.  ABDOMEN: Soft, nondistended, nontender. Bowel sounds present. No organomegaly or mass.  EXTREMITIES: No pedal edema, cyanosis, or clubbing.  NEUROLOGIC: Cranial nerves II through XII are intact. Muscle strength 5/5 in all extremities. Sensation intact. Gait not checked.  PSYCHIATRIC: The patient is alert and oriented x 3.  Normal  affect and good eye contact. SKIN: No obvious rash, lesion, or ulcer.   LABORATORY PANEL:   CBC Recent Labs  Lab 10/02/21 1922  WBC 11.2*  HGB 14.1  HCT 41.8  PLT 141*   ------------------------------------------------------------------------------------------------------------------  Chemistries  Recent Labs  Lab 10/02/21 1922  NA 138  K 3.5  CL 104  CO2 24  GLUCOSE 125*  BUN 15  CREATININE 0.83  CALCIUM 9.3   ------------------------------------------------------------------------------------------------------------------  Cardiac Enzymes No results for input(s): TROPONINI in the last 168 hours. ------------------------------------------------------------------------------------------------------------------  RADIOLOGY:  DG Chest 1 View  Result Date: 10/02/2021 CLINICAL DATA:  Recent fall with left hip pain and chest pain, initial encounter EXAM: CHEST  1 VIEW COMPARISON:  11/15/2020 FINDINGS: Cardiac shadow is at the upper limits of normal in size. Mild aortic calcifications are noted. Lungs are hyperinflated bilaterally. No focal infiltrate is seen. Some pleural based apical density is noted on the right which may simply represent scarring although further evaluation is recommended. No acute bony abnormality is noted. Healed rib fractures on the left are noted. IMPRESSION: Mild pleural based apical density on the right. This may simply represent underlying scarring although the possibility of a more focal lesion would deserve consideration. Noncontrast CT may be helpful for further evaluation. Electronically Signed   By: Alcide Clever M.D.   On: 10/02/2021 19:46   DG Hip Unilat With Pelvis 2-3 Views Left  Result Date: 10/02/2021 CLINICAL DATA:  Status post fall.  Decreased range of motion. EXAM: DG HIP (WITH OR WITHOUT PELVIS) 2-3V LEFT COMPARISON:  None. FINDINGS: Displaced left subcapital femoral neck fracture with 15 mm of superior displacement. No other fracture or  dislocation. Generalized osteopenia. Mild degenerative changes of the pubic symphysis. IMPRESSION: Acute displaced left subcapital femoral neck fracture with 15 mm of superior displacement. Electronically Signed   By: Elige Ko M.D.   On: 10/02/2021 19:45      IMPRESSION AND PLAN:  Principal Problem:   Closed left hip fracture (HCC)  1.  Closed left hip fracture secondary to mechanical fall. - The patient will be admitted to a medical-surgical bed. - Pain management will be provided. - Orthopedic consultation will be obtained. - Dr. Martha Clan was notified about the patient. - She will be n.p.o. after midnight. - She has no history of CHF, CVA, coronary artery disease, renal failure with creatinine more than 2 or diabetes mellitus on insulin.  She is considered at average risk for her age for perioperative cardiovascular events per the revised cardiac risk index.  She has no current pulmonary issues.  2.  Leukocytosis. - This likely due to stress demargination. - We will add urinalysis.    DVT prophylaxis: SCDs.  Medical prophylaxis postponed till postoperative period for bleeding risk. Code Status: full code. Family Communication:  The plan of care was discussed in details with the patient (and family). I answered all questions. The patient agreed to proceed with the above mentioned plan. Further management will depend upon hospital course. Disposition Plan: Back to previous home environment Consults called: Orthopedic consult.  All the records are reviewed and case discussed with ED provider.  Status  is: Inpatient  Remains inpatient appropriate because:Ongoing diagnostic testing needed not appropriate for outpatient work up, Unsafe d/c plan, IV treatments appropriate due to intensity of illness or inability to take PO, and Inpatient level of care appropriate due to severity of illness   Dispo: The patient is from: Home              Anticipated d/c is to: Home              Patient  currently is not medically stable to d/c.              Difficult to place patient: No  TOTAL TIME TAKING CARE OF THIS PATIENT: 50 minutes.     Hannah Beat M.D on 10/02/2021 at 8:00 PM  [facility]   From 7 PM-7 AM, contact night-coverage [email or web address]  CC: Primary care physician; Duanne Limerick, MD

## 2021-10-03 ENCOUNTER — Inpatient Hospital Stay: Payer: Medicare HMO | Admitting: Anesthesiology

## 2021-10-03 ENCOUNTER — Encounter
Admission: EM | Disposition: A | Discharge status: Home / Self Care | Payer: Self-pay | Source: Home / Self Care | Attending: Internal Medicine

## 2021-10-03 ENCOUNTER — Inpatient Hospital Stay: Payer: Medicare HMO

## 2021-10-03 ENCOUNTER — Encounter: Payer: Self-pay | Admitting: Family Medicine

## 2021-10-03 HISTORY — PX: HIP ARTHROPLASTY: SHX981

## 2021-10-03 LAB — HEPATIC FUNCTION PANEL
ALT: 15 U/L (ref 0–44)
AST: 19 U/L (ref 15–41)
Albumin: 3.4 g/dL — ABNORMAL LOW (ref 3.5–5.0)
Alkaline Phosphatase: 41 U/L (ref 38–126)
Bilirubin, Direct: 0.2 mg/dL (ref 0.0–0.2)
Indirect Bilirubin: 0.9 mg/dL (ref 0.3–0.9)
Total Bilirubin: 1.1 mg/dL (ref 0.3–1.2)
Total Protein: 5.9 g/dL — ABNORMAL LOW (ref 6.5–8.1)

## 2021-10-03 LAB — CBC
HCT: 37.7 % (ref 36.0–46.0)
Hemoglobin: 12.4 g/dL (ref 12.0–15.0)
MCH: 30.8 pg (ref 26.0–34.0)
MCHC: 32.9 g/dL (ref 30.0–36.0)
MCV: 93.5 fL (ref 80.0–100.0)
Platelets: 130 10*3/uL — ABNORMAL LOW (ref 150–400)
RBC: 4.03 MIL/uL (ref 3.87–5.11)
RDW: 12.9 % (ref 11.5–15.5)
WBC: 6.5 10*3/uL (ref 4.0–10.5)
nRBC: 0 % (ref 0.0–0.2)

## 2021-10-03 LAB — TYPE AND SCREEN
ABO/RH(D): A POS
Antibody Screen: NEGATIVE

## 2021-10-03 LAB — BASIC METABOLIC PANEL
Anion gap: 3 — ABNORMAL LOW (ref 5–15)
BUN: 14 mg/dL (ref 8–23)
CO2: 26 mmol/L (ref 22–32)
Calcium: 8.3 mg/dL — ABNORMAL LOW (ref 8.9–10.3)
Chloride: 109 mmol/L (ref 98–111)
Creatinine, Ser: 0.73 mg/dL (ref 0.44–1.00)
GFR, Estimated: >60 mL/min (ref >60)
Glucose, Bld: 111 mg/dL — ABNORMAL HIGH (ref 70–99)
Potassium: 4.1 mmol/L (ref 3.5–5.1)
Sodium: 138 mmol/L (ref 135–145)

## 2021-10-03 SURGERY — HEMIARTHROPLASTY, HIP, DIRECT ANTERIOR APPROACH, FOR FRACTURE
Anesthesia: Spinal | Site: Hip | Laterality: Left

## 2021-10-03 MED ORDER — HYDROCODONE-ACETAMINOPHEN 5-325 MG PO TABS
1.0000 | ORAL_TABLET | ORAL | Status: DC | PRN
  Administered 2021-10-04 (×2): 2 via ORAL
  Administered 2021-10-04: 1 via ORAL
  Administered 2021-10-04: 2 via ORAL
  Administered 2021-10-05 (×2): 1 via ORAL
  Administered 2021-10-06 (×2): 2 via ORAL
  Filled 2021-10-03: qty 2
  Filled 2021-10-03: qty 1
  Filled 2021-10-03: qty 2
  Filled 2021-10-03: qty 1
  Filled 2021-10-03 (×4): qty 2

## 2021-10-03 MED ORDER — 0.9 % SODIUM CHLORIDE (POUR BTL) OPTIME
TOPICAL | Status: DC | PRN
  Administered 2021-10-03: 1500 mL

## 2021-10-03 MED ORDER — ALUM & MAG HYDROXIDE-SIMETH 200-200-20 MG/5ML PO SUSP: 30.0000 mL | ORAL | Status: DC | PRN

## 2021-10-03 MED ORDER — PROPOFOL 10 MG/ML IV BOLUS
INTRAVENOUS | Status: AC
  Filled 2021-10-03: qty 20

## 2021-10-03 MED ORDER — NEOMYCIN-POLYMYXIN B GU 40-200000 IR SOLN
Status: DC | PRN
  Administered 2021-10-03: 2 mL
  Administered 2021-10-03: 12 mL

## 2021-10-03 MED ORDER — SODIUM CHLORIDE 0.9 % IR SOLN
Status: DC | PRN
  Administered 2021-10-03: 3000 mL

## 2021-10-03 MED ORDER — NEOMYCIN-POLYMYXIN B GU 40-200000 IR SOLN
Status: AC
  Filled 2021-10-03: qty 2

## 2021-10-03 MED ORDER — PHENYLEPHRINE HCL (PRESSORS) 10 MG/ML IV SOLN
INTRAVENOUS | Status: DC | PRN
  Administered 2021-10-03: 40 ug via INTRAVENOUS
  Administered 2021-10-03: 80 ug via INTRAVENOUS

## 2021-10-03 MED ORDER — LACTATED RINGERS IV SOLN: INTRAVENOUS | Status: DC | PRN

## 2021-10-03 MED ORDER — ONDANSETRON HCL 4 MG PO TABS
4.0000 mg | ORAL_TABLET | Freq: Four times a day (QID) | ORAL | Status: DC | PRN
  Administered 2021-10-05: 4 mg via ORAL
  Filled 2021-10-03: qty 1

## 2021-10-03 MED ORDER — KETAMINE HCL 50 MG/5ML IJ SOSY
PREFILLED_SYRINGE | INTRAMUSCULAR | Status: AC
  Filled 2021-10-03: qty 5

## 2021-10-03 MED ORDER — OXYCODONE HCL 5 MG PO TABS: 5.0000 mg | ORAL_TABLET | Freq: Once | ORAL | Status: DC | PRN

## 2021-10-03 MED ORDER — CEFAZOLIN SODIUM-DEXTROSE 2-4 GM/100ML-% IV SOLN
2.0000 g | Freq: Four times a day (QID) | INTRAVENOUS | Status: AC
  Administered 2021-10-04: 2 g via INTRAVENOUS
  Filled 2021-10-03 (×2): qty 100

## 2021-10-03 MED ORDER — CHLORHEXIDINE GLUCONATE CLOTH 2 % EX PADS: 6.0000 | MEDICATED_PAD | Freq: Every day | CUTANEOUS | Status: DC

## 2021-10-03 MED ORDER — OXYCODONE HCL 5 MG/5ML PO SOLN: 5.0000 mg | Freq: Once | ORAL | Status: DC | PRN

## 2021-10-03 MED ORDER — ACETAMINOPHEN 10 MG/ML IV SOLN: 1000.0000 mg | Freq: Once | INTRAVENOUS | Status: DC | PRN

## 2021-10-03 MED ORDER — KETAMINE HCL 10 MG/ML IJ SOLN
INTRAMUSCULAR | Status: DC | PRN
  Administered 2021-10-03: 20 mg via INTRAVENOUS

## 2021-10-03 MED ORDER — DOCUSATE SODIUM 100 MG PO CAPS
100.0000 mg | ORAL_CAPSULE | Freq: Two times a day (BID) | ORAL | Status: DC
  Administered 2021-10-03 – 2021-10-06 (×6): 100 mg via ORAL
  Filled 2021-10-03 (×6): qty 1

## 2021-10-03 MED ORDER — METHOCARBAMOL 1000 MG/10ML IJ SOLN
500.0000 mg | Freq: Four times a day (QID) | INTRAVENOUS | Status: DC | PRN
  Filled 2021-10-03: qty 5

## 2021-10-03 MED ORDER — FENTANYL CITRATE (PF) 100 MCG/2ML IJ SOLN
INTRAMUSCULAR | Status: DC | PRN
  Administered 2021-10-03 (×2): 50 ug via INTRAVENOUS

## 2021-10-03 MED ORDER — CEFAZOLIN SODIUM-DEXTROSE 2-4 GM/100ML-% IV SOLN
INTRAVENOUS | Status: AC
  Administered 2021-10-03: 2 g via INTRAVENOUS
  Filled 2021-10-03: qty 100

## 2021-10-03 MED ORDER — MORPHINE SULFATE (PF) 2 MG/ML IV SOLN
0.5000 mg | INTRAVENOUS | Status: DC | PRN
  Administered 2021-10-03 – 2021-10-04 (×3): 1 mg via INTRAVENOUS
  Filled 2021-10-03 (×3): qty 1

## 2021-10-03 MED ORDER — SENNA 8.6 MG PO TABS
1.0000 | ORAL_TABLET | Freq: Two times a day (BID) | ORAL | Status: DC
  Administered 2021-10-03 – 2021-10-06 (×6): 8.6 mg via ORAL
  Filled 2021-10-03 (×6): qty 1

## 2021-10-03 MED ORDER — FENTANYL CITRATE (PF) 100 MCG/2ML IJ SOLN
INTRAMUSCULAR | Status: AC
  Filled 2021-10-03: qty 2

## 2021-10-03 MED ORDER — CEFAZOLIN SODIUM-DEXTROSE 2-4 GM/100ML-% IV SOLN
2.0000 g | INTRAVENOUS | Status: AC
  Administered 2021-10-03: 2 g via INTRAVENOUS

## 2021-10-03 MED ORDER — FENTANYL CITRATE (PF) 100 MCG/2ML IJ SOLN: 25.0000 ug | INTRAMUSCULAR | Status: DC | PRN

## 2021-10-03 MED ORDER — TRAMADOL HCL 50 MG PO TABS
50.0000 mg | ORAL_TABLET | Freq: Four times a day (QID) | ORAL | Status: DC
  Administered 2021-10-03 – 2021-10-06 (×11): 50 mg via ORAL
  Filled 2021-10-03 (×11): qty 1

## 2021-10-03 MED ORDER — METHOCARBAMOL 500 MG PO TABS
500.0000 mg | ORAL_TABLET | Freq: Four times a day (QID) | ORAL | Status: DC | PRN
  Administered 2021-10-03 – 2021-10-05 (×4): 500 mg via ORAL
  Filled 2021-10-03 (×4): qty 1

## 2021-10-03 MED ORDER — ENOXAPARIN SODIUM 30 MG/0.3ML IJ SOSY
30.0000 mg | PREFILLED_SYRINGE | INTRAMUSCULAR | Status: DC
  Administered 2021-10-04 – 2021-10-06 (×3): 30 mg via SUBCUTANEOUS
  Filled 2021-10-03 (×3): qty 0.3

## 2021-10-03 MED ORDER — POLYETHYLENE GLYCOL 3350 17 G PO PACK: 17.0000 g | PACK | Freq: Every day | ORAL | Status: DC | PRN

## 2021-10-03 MED ORDER — PROMETHAZINE HCL 25 MG/ML IJ SOLN: 6.2500 mg | INTRAMUSCULAR | Status: DC | PRN

## 2021-10-03 MED ORDER — PHENYLEPHRINE HCL-NACL 20-0.9 MG/250ML-% IV SOLN
INTRAVENOUS | Status: DC | PRN
  Administered 2021-10-03: 20 ug/min via INTRAVENOUS

## 2021-10-03 MED ORDER — ONDANSETRON HCL 4 MG/2ML IJ SOLN
4.0000 mg | Freq: Four times a day (QID) | INTRAMUSCULAR | Status: DC | PRN
  Administered 2021-10-03 – 2021-10-04 (×2): 4 mg via INTRAVENOUS
  Filled 2021-10-03: qty 2

## 2021-10-03 MED ORDER — PROPOFOL 500 MG/50ML IV EMUL
INTRAVENOUS | Status: DC | PRN
  Administered 2021-10-03: 75 ug/kg/min via INTRAVENOUS

## 2021-10-03 MED ORDER — BISACODYL 10 MG RE SUPP: 10.0000 mg | Freq: Every day | RECTAL | Status: DC | PRN

## 2021-10-03 SURGICAL SUPPLY — 54 items
BLADE SAGITTAL WIDE XTHICK NO (BLADE) ×2 IMPLANT
BLADE SURG SZ10 CARB STEEL (BLADE) ×2 IMPLANT
BNDG COHESIVE 4X5 TAN ST LF (GAUZE/BANDAGES/DRESSINGS) ×2 IMPLANT
COVER BACK TABLE REUSABLE LG (DRAPES) ×2 IMPLANT
DRAPE 3/4 80X56 (DRAPES) ×4 IMPLANT
DRAPE INCISE IOBAN 66X60 STRL (DRAPES) ×2 IMPLANT
DRAPE ORTHO SPLIT 77X108 STRL (DRAPES) ×2
DRAPE SURG 17X11 SM STRL (DRAPES) ×2 IMPLANT
DRAPE SURG ORHT 6 SPLT 77X108 (DRAPES) ×2 IMPLANT
DRSG OPSITE POSTOP 4X10 (GAUZE/BANDAGES/DRESSINGS) ×2 IMPLANT
DURAPREP 26ML APPLICATOR (WOUND CARE) ×8 IMPLANT
ELECT CAUTERY BLADE 6.4 (BLADE) ×2 IMPLANT
ELECT REM PT RETURN 9FT ADLT (ELECTROSURGICAL) ×2
ELECTRODE REM PT RTRN 9FT ADLT (ELECTROSURGICAL) ×1 IMPLANT
GAUZE 4X4 16PLY ~~LOC~~+RFID DBL (SPONGE) IMPLANT
GAUZE SPONGE 4X4 12PLY STRL (GAUZE/BANDAGES/DRESSINGS) IMPLANT
GAUZE XEROFORM 1X8 LF (GAUZE/BANDAGES/DRESSINGS) ×4 IMPLANT
GLOVE SURG ORTHO LTX SZ9 (GLOVE) ×4 IMPLANT
GLOVE SURG UNDER POLY LF SZ9 (GLOVE) ×2 IMPLANT
GOWN STRL REUS TWL 2XL XL LVL4 (GOWN DISPOSABLE) ×2 IMPLANT
GOWN STRL REUS W/ TWL LRG LVL3 (GOWN DISPOSABLE) ×1 IMPLANT
GOWN STRL REUS W/TWL LRG LVL3 (GOWN DISPOSABLE) ×1
HEAD MODULAR ENDO (Orthopedic Implant) ×1 IMPLANT
HEAD UNPLR 44XMDLR STRL HIP (Orthopedic Implant) ×1 IMPLANT
HEMOVAC 400ML (MISCELLANEOUS) ×2
IV NS IRRIG 3000ML ARTHROMATIC (IV SOLUTION) ×2 IMPLANT
KIT DRAIN HEMOVAC JP 7FR 400ML (MISCELLANEOUS) ×1 IMPLANT
KIT TURNOVER KIT A (KITS) ×2 IMPLANT
MANIFOLD NEPTUNE II (INSTRUMENTS) ×2 IMPLANT
NDL SAFETY ECLIPSE 18X1.5 (NEEDLE) ×1 IMPLANT
NEEDLE FILTER BLUNT 18X 1/2SAF (NEEDLE) ×1
NEEDLE FILTER BLUNT 18X1 1/2 (NEEDLE) ×1 IMPLANT
NEEDLE HYPO 18GX1.5 SHARP (NEEDLE) ×1
NEEDLE MAYO CATGUT SZ4 (NEEDLE) ×2 IMPLANT
NS IRRIG 1000ML POUR BTL (IV SOLUTION) ×2 IMPLANT
PACK HIP PROSTHESIS (MISCELLANEOUS) ×2 IMPLANT
PILLOW ABDUCTION FOAM SM (MISCELLANEOUS) ×2 IMPLANT
PULSAVAC PLUS IRRIG FAN TIP (DISPOSABLE) ×2
RETRIEVER SUT HEWSON (MISCELLANEOUS) ×2 IMPLANT
SLEEVE UNITRAX V40 (Orthopedic Implant) ×1 IMPLANT
SLEEVE UNITRAX V40 +4 (Orthopedic Implant) ×1 IMPLANT
SPONGE T-LAP 18X18 ~~LOC~~+RFID (SPONGE) ×6 IMPLANT
STAPLER SKIN PROX 35W (STAPLE) IMPLANT
STEM FEM ACCOLADE 38X102X30 S3 (Stem) ×2 IMPLANT
SUT TICRON 2-0 30IN 311381 (SUTURE) ×12 IMPLANT
SUT VIC AB 0 CT1 36 (SUTURE) ×4 IMPLANT
SUT VIC AB 2-0 CT2 27 (SUTURE) ×6 IMPLANT
SYR 10ML LL (SYRINGE) ×2 IMPLANT
TAPE MICROFOAM 4IN (TAPE) IMPLANT
TAPE TRANSPORE STRL 2 31045 (GAUZE/BANDAGES/DRESSINGS) ×2 IMPLANT
TIP BRUSH PULSAVAC PLUS 24.33 (MISCELLANEOUS) IMPLANT
TIP FAN IRRIG PULSAVAC PLUS (DISPOSABLE) ×1 IMPLANT
TUBE SUCT KAM VAC (TUBING) IMPLANT
WATER STERILE IRR 500ML POUR (IV SOLUTION) IMPLANT

## 2021-10-03 NOTE — Anesthesia Procedure Notes (Addendum)
Spinal  Patient location during procedure: OR Start time: 10/03/2021 1:44 PM End time: 10/03/2021 1:44 PM Reason for block: surgical anesthesia Staffing Performed: anesthesiologist  Anesthesiologist: Foye Deer, MD Preanesthetic Checklist Completed: patient identified, IV checked, site marked, risks and benefits discussed, surgical consent, monitors and equipment checked, pre-op evaluation and timeout performed Spinal Block Patient position: right lateral decubitus Prep: DuraPrep Patient monitoring: heart rate, cardiac monitor, continuous pulse ox and blood pressure Approach: midline Location: L3-4 Injection technique: single-shot Needle Needle type: Sprotte  Needle gauge: 24 G Needle length: 9 cm Assessment Sensory level: T10 Events: CSF return Additional Notes Two attempts. First attempt with possible sided paresthesia vs right back pain.

## 2021-10-03 NOTE — Transfer of Care (Signed)
Immediate Anesthesia Transfer of Care Note  Patient: Connie Lee  Procedure(s) Performed: ARTHROPLASTY BIPOLAR HIP (HEMIARTHROPLASTY) (Left: Hip)  Patient Location: PACU  Anesthesia Type:Regional  Level of Consciousness: awake and sedated  Airway & Oxygen Therapy: Patient Spontanous Breathing and Patient connected to nasal cannula oxygen  Post-op Assessment: Report given to RN and Post -op Vital signs reviewed and stable  Post vital signs: Reviewed and stable  Last Vitals:  Vitals Value Taken Time  BP    Temp    Pulse    Resp    SpO2      Last Pain:  Vitals:   10/03/21 1214  TempSrc: Oral  PainSc: 5       Patients Stated Pain Goal: 0 (10/03/21 0418)  Complications: No notable events documented.

## 2021-10-03 NOTE — Consult Note (Signed)
ORTHOPAEDIC CONSULTATION  REQUESTING PHYSICIAN: Arnetha Courser, MD  Chief Complaint: Left hip pain status post fall  HPI: Connie Lee is a 80 y.o. female who was seen by me at 9 AM this morning and reevaluated again in preop now.  Patient's daughter is at the bedside.  Patient complains of left hip pain after a chemical fall at home onto her left side.  Patient was brought to the [facility] emergency department where she was diagnosed with a displaced left femoral neck hip fracture by x-ray.  Patient is admitted to the hospitalist service and orthopedics is consulted for management of her left hip fracture.  History reviewed. No pertinent past medical history. Past Surgical History:  Procedure Laterality Date   ABDOMINAL HYSTERECTOMY     APPENDECTOMY     Social History   Socioeconomic History   Marital status: Widowed    Spouse name: Not on file   Number of children: 3   Years of education: Not on file   Highest education level: Not on file  Occupational History   Not on file  Tobacco Use   Smoking status: Never   Smokeless tobacco: Never  Vaping Use   Vaping Use: Never used  Substance and Sexual Activity   Alcohol use: Not Currently   Drug use: Not Currently   Sexual activity: Not on file  Other Topics Concern   Not on file  Social History Narrative   Not on file   Social Determinants of Health   Financial Resource Strain: Low Risk    Difficulty of Paying Living Expenses: Not hard at all  Food Insecurity: No Food Insecurity   Worried About Running Out of Food in the Last Year: Never true   Ran Out of Food in the Last Year: Never true  Transportation Needs: No Transportation Needs   Lack of Transportation (Medical): No   Lack of Transportation (Non-Medical): No  Physical Activity: Inactive   Days of Exercise per Week: 0 days   Minutes of Exercise per Session: 0 min  Stress: No Stress Concern Present   Feeling of Stress : Not at all  Social  Connections: Moderately Isolated   Frequency of Communication with Friends and Family: More than three times a week   Frequency of Social Gatherings with Friends and Family: More than three times a week   Attends Religious Services: More than 4 times per year   Active Member of [employer] or Organizations: No   Attends Banker Meetings: Never   Marital Status: Widowed   History reviewed. No pertinent family history. No Known Allergies Prior to Admission medications   Not on File   DG Chest 1 View  Result Date: 10/02/2021 CLINICAL DATA:  Recent fall with left hip pain and chest pain, initial encounter EXAM: CHEST  1 VIEW COMPARISON:  11/15/2020 FINDINGS: Cardiac shadow is at the upper limits of normal in size. Mild aortic calcifications are noted. Lungs are hyperinflated bilaterally. No focal infiltrate is seen. Some pleural based apical density is noted on the right which may simply represent scarring although further evaluation is recommended. No acute bony abnormality is noted. Healed rib fractures on the left are noted. IMPRESSION: Mild pleural based apical density on the right. This may simply represent underlying scarring although the possibility of a more focal lesion would deserve consideration. Noncontrast CT may be helpful for further evaluation. Electronically Signed   By: Alcide Clever M.D.   On: 10/02/2021 19:46   DG Hip Unilat  With Pelvis 2-3 Views Left  Result Date: 10/02/2021 CLINICAL DATA:  Status post fall.  Decreased range of motion. EXAM: DG HIP (WITH OR WITHOUT PELVIS) 2-3V LEFT COMPARISON:  None. FINDINGS: Displaced left subcapital femoral neck fracture with 15 mm of superior displacement. No other fracture or dislocation. Generalized osteopenia. Mild degenerative changes of the pubic symphysis. IMPRESSION: Acute displaced left subcapital femoral neck fracture with 15 mm of superior displacement. Electronically Signed   By: Elige Ko M.D.   On: 10/02/2021 19:45     Positive ROS: All other systems have been reviewed and were otherwise negative with the exception of those mentioned in the HPI and as above.  Physical Exam: General: Alert, no acute distress  MUSCULOSKELETAL: Left hip: Patient skin is intact.  There is no erythema ecchymosis or significant swelling.  Patient has shortening and external rotation of her left hip.  Patient has palpable pedal pulses, intact sensation light touch and intact motor function distally.  Assessment: Displaced left femoral neck hip fracture  Plan: I explained to the patient and her daughter this morning the nature of her fracture.  She has a displaced left femoral neck Fracture.  I recommended Hemi arthroplasty for this.  I discussed the details of the operation as well as the postoperative course with the patient and her daughter.  I discussed the risks and benefits of surgery. The risks include but are not limited to infection, bleeding requiring blood transfusion, nerve or blood vessel injury, joint stiffness or loss of motion, persistent pain, weakness or instability, fracture, dislocation, leg length discrepancy and change in lower extremity rotation, hardware failure and the need for further surgery. Medical risks include but are not limited to DVT and pulmonary embolism, myocardial infarction, stroke, pneumonia, respiratory failure and death. Patient understood these risks and wished to proceed.  Patient has been cleared by the medical service.  She is not on anticoagulation therapy at baseline.   Juanell Fairly, MD    10/03/2021 12:57 PM

## 2021-10-03 NOTE — Progress Notes (Signed)
PROGRESS NOTE    Connie Lee  ZDG:[id number] DOB: 1941/06/13 DOA: 10/02/2021 PCP: Duanne Limerick, MD   Brief Narrative: Taken from H&P. Connie Lee is a 80 y.o. Caucasian female with no significant medical history, who presented to the ER with acute onset of left hip pain after an accidental mechanical fall after tripping over on her animals.  Found to have a displaced left femoral neck fracture. Orthopedic was consulted and she will be going to the OR for left hemiarthroplasty today.  Subjective: Patient was seen and examined today.  Pain was well controlled under current regimen as far as she does not move her leg.  Daughter at bedside.  Waiting for procedure.  Patient has no significant underlying medical condition and does not take any medications on a regular basis except Tylenol as needed.  She lives with her daughter and would like to go back home with home health services on discharge.  Assessment & Plan:   Principal Problem:   Closed left hip fracture (HCC)  Left femoral neck fracture.  Secondary to mechanical fall.  Going for left hemiarthroplasty with orthopedic surgery later today. -Continue with pain management -Follow-up postoperative recommendations -Postoperative PT/OT evaluation  Leukocytosis.  Most likely reactive due to stress margination as it has been resolved.  Objective: Vitals:   10/03/21 0330 10/03/21 0418 10/03/21 0742 10/03/21 1214  BP: (!) 119/53 (!) 147/67 131/60 127/62  Pulse: 66 70 66 85  Resp: 12 19 16 16   Temp:  98 F (36.7 C) 98.4 F (36.9 C) 98.2 F (36.8 C)  TempSrc:   Oral Oral  SpO2: 98% 100% 100% 95%  Weight:  65.2 kg    Height:  5\' 5"  (1.651 m)      Intake/Output Summary (Last 24 hours) at 10/03/2021 1316 Last data filed at 10/03/2021 0455 Gross per 24 hour  Intake 1148.67 ml  Output 500 ml  Net 648.67 ml   Filed Weights   10/02/21 1753 10/03/21 0418  Weight: 66.8 kg 65.2 kg    Examination:  General exam:  Appears calm and comfortable  Respiratory system: Clear to auscultation. Respiratory effort normal. Cardiovascular system: S1 & S2 heard, RRR.  Gastrointestinal system: Soft, nontender, nondistended, bowel sounds positive. Central nervous system: Alert and oriented. No focal neurological deficits. Extremities: No edema, no cyanosis, pulses intact and symmetrical. Psychiatry: Judgement and insight appear normal.    DVT prophylaxis: Lovenox after the surgery Code Status: Full Family Communication: Discussed with daughter at bedside Disposition Plan:  Status is: Inpatient  Remains inpatient appropriate because:    Level of care: Med-Surg  All the records are reviewed and case discussed with Care Management/Social Worker. Management plans discussed with the patient, nursing and they are in agreement.  Consultants:  Orthopedic surgery  Procedures:  Antimicrobials:   Data Reviewed: I have personally reviewed following labs and imaging studies  CBC: Recent Labs  Lab 10/02/21 1922 10/03/21 0611  WBC 11.2* 6.5  NEUTROABS 9.5*  --   HGB 14.1 12.4  HCT 41.8 37.7  MCV 92.3 93.5  PLT 141* 130*   Basic Metabolic Panel: Recent Labs  Lab 10/02/21 1922 10/03/21 0611  NA 138 138  K 3.5 4.1  CL 104 109  CO2 24 26  GLUCOSE 125* 111*  BUN 15 14  CREATININE 0.83 0.73  CALCIUM 9.3 8.3*  MG 2.1  --    GFR: Estimated Creatinine Clearance: 49.6 mL/min (by C-G formula based on SCr of 0.73 mg/dL). Liver Function Tests:  Recent Labs  Lab 10/03/21 0611  AST 19  ALT 15  ALKPHOS 41  BILITOT 1.1  PROT 5.9*  ALBUMIN 3.4*   No results for input(s): LIPASE, AMYLASE in the last 168 hours. No results for input(s): AMMONIA in the last 168 hours. Coagulation Profile: Recent Labs  Lab 10/02/21 1922  INR 1.1   Cardiac Enzymes: No results for input(s): CKTOTAL, CKMB, CKMBINDEX, TROPONINI in the last 168 hours. BNP (last 3 results) No results for input(s): PROBNP in the last 8760  hours. HbA1C: No results for input(s): HGBA1C in the last 72 hours. CBG: No results for input(s): GLUCAP in the last 168 hours. Lipid Profile: No results for input(s): CHOL, HDL, LDLCALC, TRIG, CHOLHDL, LDLDIRECT in the last 72 hours. Thyroid Function Tests: No results for input(s): TSH, T4TOTAL, FREET4, T3FREE, THYROIDAB in the last 72 hours. Anemia Panel: No results for input(s): VITAMINB12, FOLATE, FERRITIN, TIBC, IRON, RETICCTPCT in the last 72 hours. Sepsis Labs: No results for input(s): PROCALCITON, LATICACIDVEN in the last 168 hours.  Recent Results (from the past 240 hour(s))  Resp Panel by RT-PCR (Flu A&B, Covid) Nasopharyngeal Swab     Status: None   Collection Time: 10/02/21  7:22 PM   Specimen: Nasopharyngeal Swab; Nasopharyngeal(NP) swabs in vial transport medium  Result Value Ref Range Status   SARS Coronavirus 2 by RT PCR NEGATIVE NEGATIVE Final    Comment: (NOTE) SARS-CoV-2 target nucleic acids are NOT DETECTED.  The SARS-CoV-2 RNA is generally detectable in upper respiratory specimens during the acute phase of infection. The lowest concentration of SARS-CoV-2 viral copies this assay can detect is 138 copies/mL. A negative result does not preclude SARS-Cov-2 infection and should not be used as the sole basis for treatment or other patient management decisions. A negative result may occur with  improper specimen collection/handling, submission of specimen other than nasopharyngeal swab, presence of viral mutation(s) within the areas targeted by this assay, and inadequate number of viral copies(<138 copies/mL). A negative result must be combined with clinical observations, patient history, and epidemiological information. The expected result is Negative.  Fact Sheet for Patients:  BloggerCourse.com  Fact Sheet for Healthcare Providers:  SeriousBroker.it  This test is no t yet approved or cleared by the [place] FDA and  has been authorized for detection and/or diagnosis of SARS-CoV-2 by FDA under an Emergency Use Authorization (EUA). This EUA will remain  in effect (meaning this test can be used) for the duration of the COVID-19 declaration under Section 564(b)(1) of the Act, 21 U.S.C.section 360bbb-3(b)(1), unless the authorization is terminated  or revoked sooner.       Influenza A by PCR NEGATIVE NEGATIVE Final   Influenza B by PCR NEGATIVE NEGATIVE Final    Comment: (NOTE) The Xpert Xpress SARS-CoV-2/FLU/RSV plus assay is intended as an aid in the diagnosis of influenza from Nasopharyngeal swab specimens and should not be used as a sole basis for treatment. Nasal washings and aspirates are unacceptable for Xpert Xpress SARS-CoV-2/FLU/RSV testing.  Fact Sheet for Patients: BloggerCourse.com  Fact Sheet for Healthcare Providers: SeriousBroker.it  This test is not yet approved or cleared by the [place] FDA and has been authorized for detection and/or diagnosis of SARS-CoV-2 by FDA under an Emergency Use Authorization (EUA). This EUA will remain in effect (meaning this test can be used) for the duration of the COVID-19 declaration under Section 564(b)(1) of the Act, 21 U.S.C. section 360bbb-3(b)(1), unless the authorization is terminated or revoked.  Performed at [facility], [address] [place], [place] [address]      Radiology Studies: DG Chest 1 View  Result Date: 10/02/2021 CLINICAL DATA:  Recent fall with left hip pain and chest pain, initial encounter EXAM: CHEST  1 VIEW COMPARISON:  11/15/2020 FINDINGS: Cardiac shadow is at the upper limits of normal in size. Mild aortic calcifications are noted. Lungs are hyperinflated bilaterally. No focal infiltrate is seen. Some pleural based apical density is noted on the right which may simply represent scarring although further evaluation is recommended. No  acute bony abnormality is noted. Healed rib fractures on the left are noted. IMPRESSION: Mild pleural based apical density on the right. This may simply represent underlying scarring although the possibility of a more focal lesion would deserve consideration. Noncontrast CT may be helpful for further evaluation. Electronically Signed   By: Alcide Clever M.D.   On: 10/02/2021 19:46   DG Hip Unilat With Pelvis 2-3 Views Left  Result Date: 10/02/2021 CLINICAL DATA:  Status post fall.  Decreased range of motion. EXAM: DG HIP (WITH OR WITHOUT PELVIS) 2-3V LEFT COMPARISON:  None. FINDINGS: Displaced left subcapital femoral neck fracture with 15 mm of superior displacement. No other fracture or dislocation. Generalized osteopenia. Mild degenerative changes of the pubic symphysis. IMPRESSION: Acute displaced left subcapital femoral neck fracture with 15 mm of superior displacement. Electronically Signed   By: Elige Ko M.D.   On: 10/02/2021 19:45    Scheduled Meds: Continuous Infusions:  sodium chloride 100 mL/hr at 10/03/21 1200   ceFAZolin     [MAR Hold]  ceFAZolin (ANCEF) IV       LOS: 1 day   Time spent: 40 minutes. More than 50% of the time was spent in counseling/coordination of care  Arnetha Courser, MD [facility]  If 7PM-7AM, please contact night-coverage [email or web address]  10/03/2021, 1:16 PM   This record has been created using Conservation officer, historic buildings. Errors have been sought and corrected,but may not always be located. Such creation errors do not reflect on the standard of care.

## 2021-10-03 NOTE — Anesthesia Preprocedure Evaluation (Addendum)
Anesthesia Evaluation  Patient identified by MRN, date of birth, ID band Patient awake    Reviewed: Allergy & Precautions, H&P , NPO status , Patient's Chart, lab work & pertinent test results  Airway Mallampati: III       Dental  (+) Edentulous Upper, Edentulous Lower   Pulmonary neg pulmonary ROS,  Left 9 and 10 rib fracture- from 1/22   Pulmonary exam normal        Cardiovascular Exercise Tolerance: Good negative cardio ROS Normal cardiovascular exam Rhythm:Regular Rate:Normal - Systolic murmurs and - Peripheral Edema    Neuro/Psych negative neurological ROS  negative psych ROS   GI/Hepatic negative GI ROS, Neg liver ROS,   Endo/Other  negative endocrine ROS  Renal/GU negative Renal ROS  negative genitourinary   Musculoskeletal Left femoral neck fracture   Abdominal Normal abdominal exam  (+)   Peds negative pediatric ROS (+)  Hematology negative hematology ROS (+)   Anesthesia Other Findings   Reproductive/Obstetrics negative OB ROS                            Anesthesia Physical Anesthesia Plan  ASA: 2  Anesthesia Plan: Spinal   Post-op Pain Management:    Induction:   PONV Risk Score and Plan: 2 and Treatment may vary due to age or medical condition  Airway Management Planned: Natural Airway and Nasal Cannula  Additional Equipment:   Intra-op Plan:   Post-operative Plan:   Informed Consent: I have reviewed the patients History and Physical, chart, labs and discussed the procedure including the risks, benefits and alternatives for the proposed anesthesia with the patient or authorized representative who has indicated his/her understanding and acceptance.     Dental advisory given  Plan Discussed with: CRNA and Anesthesiologist  Anesthesia Plan Comments:        Anesthesia Quick Evaluation

## 2021-10-03 NOTE — Op Note (Signed)
10/03/2021  4:18 PM  PATIENT:  Connie Lee   MRN: [id number]  PRE-OPERATIVE DIAGNOSIS:  Left femoral neck fracture  POST-OPERATIVE DIAGNOSIS:  Left femoral neck fracture  PROCEDURE:  Procedure(s): Left hip hemiarthroplasty  PREOPERATIVE INDICATIONS:    Connie Lee is an 80 y.o. female who was admitted with a diagnosis of Left femoral neck fracture.  I have recommended surgical fixation with hemiarthroplasty for this injury. I have explained the surgery and the postoperative course to the patient and their daughter who have agreed with surgical management of this fracture.    The risks benefits and alternatives were discussed with the patient and their family including but not limited to the risks of  infection requiring removal of the prosthesis, bleeding requiring blood transfusion, nerve injury especially to the sciatic nerve leading to foot drop or lower extremity numbness, periprosthetic fracture, dislocation leg length discrepancy, change in lower extremity rotation persistent hip pain, loosening or failure of the components and the need for revision surgery. Medical risks include but are not limited to DVT and pulmonary embolism, myocardial infarction, stroke, pneumonia, respiratory failure and death.  OPERATIVE REPORT     SURGEON:  Thornton Park, MD    ASSISTANT: Surgical tech    ANESTHESIA: Spinal    COMPLICATIONS:  None.   SPECIMEN: Femoral head to pathology    COMPONENTS:  Stryker Accolade 2 femoral component size 3 with a 44 mm +4 neck adjustment sleeve.    PROCEDURE IN DETAIL:   The patient was met in the holding area and  identified.  The appropriate hip was identified and marked at the operative site after verbally confirming with the patient that this was the correct site of surgery.  The patient was then transported to the OR  and  underwent spinal anesthesia.  The patient was then placed in the lateral decubitus position with the operative side up  and secured on the operating room table with a pegboard and all bony prominences were adequately padded. This included an axillary roll and additional padding around the nonoperative leg to prevent compression to the common peroneal nerve.    The operative lower extremity was prepped and draped in a sterile fashion.  A time out was performed prior to incision to verify patient's name, date of birth, medical record number, correct site of surgery correct procedure to be performed. The timeout was also used to verify the patient received antibiotics now appropriate instruments, implants and radiographic studies were available in the room. Once all in attendance were in agreement case began.    A posterolateral approach was utilized via sharp dissection  carried down to the subcutaneous tissue.  Bleeding vessels were coagulated using electrocautery.  The fascia lata was identified and incised along the length of the skin incision.  The gluteus maximus muscle was then split in line with its fibers. Self-retaining retractors were  inserted.  With the hip internally rotated, the short external rotators  were identified and removed from the posterior attachment from the greater trochanter. The piriformis was tagged for later repair. The capsule was identified and a T-shaped capsulotomy was performed. The capsule was tagged with #2 Tycron for later repair.  The femoral neck fracture was exposed, and the femoral head was removed using a corkscrew device. This was measured to be 44 mm in diameter. The attention was then turned to proximal femur preparation.  An oscillating saw was used to perform a proximal femoral osteotomy 1 fingerbreadth above the lesser trochanter. The  trial 44 mm femoral head was placed into the acetabulum and had an excellent suction fit. The attention was then turned back to femoral preparation.    A femoral skid and Cobra retractor were placed under the femoral neck to allow for adequate  visualization. A box osteotome was used to make the initial entry into the proximal femur. A single hand reamer was used to prepare the femoral canal. A T-shaped femoral canal sounder was then used to ensure no penetration femoral cortex had occurred during reaming. The proximal femur was then sequentially broached by hand. A size 3 Accolade 2 femoral trial broach was found to have best medial to lateral canal fit. Once adequate mediolateral canal fill was achieved the trial femoral broach, neck, and head was assembled and the hip was reduced. It was found to have excellent stability, equivalent leg lengths with functional range of motion. The trial components were then removed.  I copiously irrigated the femoral canal and then impacted the real size 3 Accolade 2 femoral prosthesis into place into the appropriate version, slightly anteverted to the normal anatomy, and I impacted the actual 44 mm Unitrax femoral component with a +4 neck adjustment sleeve into place. The hip was then reduced and taken through functional range of motion and found to have excellent stability. Leg lengths were restored. The hip joint was copiously irrigated.   A soft tissue repair of the capsule and external rotators was performed using #2 Tycron Excellent posterior capsular repair was achieved. The fascia lata was then closed with interrupted 0 Vicryl suture. The subcutaneous tissues were closed with 2-0 Vicryl and the skin approximated with staples.   The patient was then placed supine on the operative table. Leg lengths were checked clinically and found to be equivalent. An abduction pillow was placed between the lower extremities. The patient was then transferred to a hospital bed and brought to the PACU in stable condition. I was scrubbed and present the entire case and all sharp and instrument counts were correct at the conclusion of the case. I spoke with the patient's daughters in the postop consultation room to let them  know the case was completed without complication patient was stable in recovery room.   Connie Gaul, MD Orthopedic Surgeon

## 2021-10-03 NOTE — Progress Notes (Signed)
Patient has been NPO for possible procedure. Patient denies pain at this time. Awaiting orthopedic specialist and to be given time of surgery.

## 2021-10-03 NOTE — Anesthesia Postprocedure Evaluation (Signed)
Anesthesia Post Note  Patient: Connie Lee  Procedure(s) Performed: ARTHROPLASTY BIPOLAR HIP (HEMIARTHROPLASTY) (Left: Hip)  Patient location during evaluation: PACU Anesthesia Type: Spinal Level of consciousness: oriented and awake and alert Pain management: pain level controlled Vital Signs Assessment: post-procedure vital signs reviewed and stable Respiratory status: spontaneous breathing, respiratory function stable and patient connected to nasal cannula oxygen Cardiovascular status: blood pressure returned to baseline and stable Postop Assessment: no headache, no backache and no apparent nausea or vomiting Anesthetic complications: no   No notable events documented.   Last Vitals:  Vitals:   10/03/21 1645 10/03/21 1700  BP: 132/60 (!) 148/76  Pulse: 95 84  Resp: 15 15  Temp: 36.8 C 36.7 C  SpO2: 96% 97%    Last Pain:  Vitals:   10/03/21 1700  TempSrc:   PainSc: 0-No pain                 Foye Deer

## 2021-10-03 NOTE — Plan of Care (Signed)

## 2021-10-04 ENCOUNTER — Encounter: Payer: Self-pay | Admitting: Orthopedic Surgery

## 2021-10-04 LAB — CBC
HCT: 33.9 % — ABNORMAL LOW (ref 36.0–46.0)
Hemoglobin: 10.9 g/dL — ABNORMAL LOW (ref 12.0–15.0)
MCH: 29.9 pg (ref 26.0–34.0)
MCHC: 32.2 g/dL (ref 30.0–36.0)
MCV: 93.1 fL (ref 80.0–100.0)
Platelets: 110 10*3/uL — ABNORMAL LOW (ref 150–400)
RBC: 3.64 MIL/uL — ABNORMAL LOW (ref 3.87–5.11)
RDW: 12.7 % (ref 11.5–15.5)
WBC: 10.1 10*3/uL (ref 4.0–10.5)
nRBC: 0 % (ref 0.0–0.2)

## 2021-10-04 LAB — RETICULOCYTES
Immature Retic Fract: 6.3 % (ref 2.3–15.9)
RBC.: 3.61 MIL/uL — ABNORMAL LOW (ref 3.87–5.11)
Retic Count, Absolute: 42.6 10*3/uL (ref 19.0–186.0)
Retic Ct Pct: 1.2 % (ref 0.4–3.1)

## 2021-10-04 LAB — BASIC METABOLIC PANEL
Anion gap: 6 (ref 5–15)
BUN: 11 mg/dL (ref 8–23)
CO2: 23 mmol/L (ref 22–32)
Calcium: 8 mg/dL — ABNORMAL LOW (ref 8.9–10.3)
Chloride: 105 mmol/L (ref 98–111)
Creatinine, Ser: 0.73 mg/dL (ref 0.44–1.00)
GFR, Estimated: >60 mL/min (ref >60)
Glucose, Bld: 139 mg/dL — ABNORMAL HIGH (ref 70–99)
Potassium: 3.8 mmol/L (ref 3.5–5.1)
Sodium: 134 mmol/L — ABNORMAL LOW (ref 135–145)

## 2021-10-04 LAB — IRON AND TIBC
Iron: 19 ug/dL — ABNORMAL LOW (ref 28–170)
Saturation Ratios: 10 % — ABNORMAL LOW (ref 10.4–31.8)
TIBC: 197 ug/dL — ABNORMAL LOW (ref 250–450)
UIBC: 178 ug/dL

## 2021-10-04 LAB — VITAMIN B12: Vitamin B-12: 263 pg/mL (ref 180–914)

## 2021-10-04 LAB — FOLATE: Folate: 10.5 ng/mL (ref >5.9)

## 2021-10-04 LAB — FERRITIN: Ferritin: 224 ng/mL (ref 11–307)

## 2021-10-04 MED ORDER — SODIUM CHLORIDE 0.9 % IV SOLN
12.5000 mg | Freq: Four times a day (QID) | INTRAVENOUS | Status: DC | PRN
  Filled 2021-10-04: qty 0.5

## 2021-10-04 MED ORDER — FE FUMARATE-B12-VIT C-FA-IFC PO CAPS
1.0000 | ORAL_CAPSULE | Freq: Two times a day (BID) | ORAL | Status: DC
  Administered 2021-10-04 – 2021-10-06 (×4): 1 via ORAL
  Filled 2021-10-04 (×6): qty 1

## 2021-10-04 NOTE — Progress Notes (Signed)
Physical Therapy Treatment Patient Details Name: Connie Lee MRN: [id number] DOB: 1941-04-27 Today's Date: 10/04/2021   History of Present Illness Connie Lee is a 80 y.o. 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 L hip fx with L hip hemiarthroplasty performed on 12/5. WBAT and post hip precautions.    PT Comments    Pt received for afternoon session. Reports improvement in nausea but is having some pain when transferring and ambulating. Continues to require increased time for bed mobility, transfers, and ambulation with min VC's for sequencing of limbs. Ultimately able to stand with minguard with cuing for hand placement and increased time to reach upright posture. Progressed to ambulating 22' in room with RW with education on increasing step length, safe sequencing of RW (keeping closer to BOS) with good carryover. Pt just very slow moving and cautious with x2 standing rest breaks due to pain and "feeling hot" but is overall very stable with RW with no LOB and maintains light use of RW for balance. No significant need for UE WB'ing. Chair follow provided and pt sitting in recliner post session. Good understanding of precautions throughout with only 1-2 min VC's for hip flexion in sitting. Discussion with pt and family if a ramp is feasible to install if pt is unable to perform stairs with pt reporting they could have a ramp installed. Rec HH PT due to great family support and needed equipment for successful mobility. Will continue to progress ambulation tolerance, stairs training in following sessions.    Recommendations for follow up therapy are one component of a multi-disciplinary discharge planning process, led by the attending physician.  Recommendations may be updated based on patient status, additional functional  criteria and insurance authorization.  Follow Up Recommendations  Home health PT     Assistance Recommended at Discharge Frequent or constant Supervision/Assistance  Equipment Recommendations  Rolling walker (2 wheels);BSC/3in1    Recommendations for Other Services       Precautions / Restrictions Precautions Precautions: Posterior Hip Precaution Booklet Issued: Yes (comment) Restrictions Weight Bearing Restrictions: Yes LLE Weight Bearing: Weight bearing as tolerated     Mobility  Bed Mobility Overal bed mobility: Needs Assistance Bed Mobility: Supine to Sit     Supine to sit: HOB elevated;Supervision Sit to supine: Max assist   General bed mobility comments: Increased time to perform with min VC's for UE/LE positioning Patient Response: Cooperative  Transfers Overall transfer level: Needs assistance Equipment used: Rolling walker (2 wheels) Transfers: Sit to/from Stand Sit to Stand: Min guard (Takes increased time to perform but is ultimately able to perform with minguard)     Step pivot transfers: Mod assist     General transfer comment: assistance for advancement of RW and max multimodal cuing for sequencing    Ambulation/Gait Ambulation/Gait assistance: Min guard Gait Distance (Feet): 22 Feet Assistive device: Rolling walker (2 wheels) Gait Pattern/deviations: Step-to pattern;Decreased stance time - left;Decreased step length - right;Shuffle       General Gait Details: Still very slow moving and takes increased time to ambulate 22'. x2 standing rest breaks throughout. Able to take larger step lengths withg cues. Is overall stable with RW.   Stairs             Wheelchair Mobility    Modified Rankin (Stroke Patients Only)  Balance Overall balance assessment: Needs assistance Sitting-balance support: No upper extremity supported;Feet supported Sitting balance-Leahy Scale: Fair     Standing balance support: During functional  activity Standing balance-Leahy Scale: Fair Standing balance comment: Relies on UE's for support on RW                            Cognition Arousal/Alertness: Awake/alert Behavior During Therapy: WFL for tasks assessed/performed Overall Cognitive Status: Within Functional Limits for tasks assessed                                          Exercises Total Joint Exercises Long Arc Quad: AROM;Seated;Both;10 reps    General Comments        Pertinent Vitals/Pain Pain Assessment: Faces Pain Score: 7  Faces Pain Scale: Hurts little more Pain Location: L hip Pain Descriptors / Indicators: Guarding;Grimacing Pain Intervention(s): Limited activity within patient's tolerance;Monitored during session;Premedicated before session;Repositioned    Home Living Family/patient expects to be discharged to:: Private residence Living Arrangements: Children Available Help at Discharge: Available 24 hours/day Type of Home: House Home Access: Stairs to enter Entrance Stairs-Rails: None Entrance Stairs-Number of Steps: 2-3   Home Layout: One level Home Equipment: Agricultural consultant (2 wheels);Shower seat;BSC/3in1      Prior Function            PT Goals (current goals can now be found in the care plan section) Acute Rehab PT Goals Patient Stated Goal: to return home with family PT Goal Formulation: With patient/family Time For Goal Achievement: 10/18/21 Potential to Achieve Goals: Good Progress towards PT goals: Progressing toward goals    Frequency    BID      PT Plan Current plan remains appropriate    Co-evaluation              AM-PAC PT "6 Clicks" Mobility   Outcome Measure  Help needed turning from your back to your side while in a flat bed without using bedrails?: A Lot Help needed moving from lying on your back to sitting on the side of a flat bed without using bedrails?: A Lot Help needed moving to and from a bed to a chair (including a  wheelchair)?: A Lot Help needed standing up from a chair using your arms (e.g., wheelchair or bedside chair)?: A Little Help needed to walk in hospital room?: A Little Help needed climbing 3-5 steps with a railing? : A Lot 6 Click Score: 14    End of Session Equipment Utilized During Treatment: Gait belt Activity Tolerance: Patient tolerated treatment well Patient left: in chair;with call bell/phone within reach;with chair alarm set;with family/visitor present Nurse Communication: Mobility status PT Visit Diagnosis: Other abnormalities of gait and mobility (R26.89);Muscle weakness (generalized) (M62.81);History of falling (Z91.81);Difficulty in walking, not elsewhere classified (R26.2)     Time: 8242-3536 PT Time Calculation (min) (ACUTE ONLY): 41 min  Charges:  $Gait Training: 38-52 mins                     Delphia Grates. Fairly IV, PT, DPT Physical Therapist- [place]  [facility]  10/04/2021, 2:40 PM

## 2021-10-04 NOTE — Progress Notes (Signed)
PROGRESS NOTE    Connie Lee  DJT:[id number] DOB: [birth year] DOA: 10/02/2021 PCP: Duanne Limerick, MD   Brief Narrative: Taken from H&P. Connie Lee is a 80 y.o. Caucasian female with no significant medical history, who presented to the ER with acute onset of left hip pain after an accidental mechanical fall after tripping over on her animals.  Found to have a displaced left femoral neck fracture. Orthopedic was consulted and she was taken to the OR on 10/03/2021 for hemiarthroplasty-tolerated the procedure well.  PT is recommending home health as patient also wants to go home.  Subjective: Patient was having some nausea while working with PT.  Having some pain at surgical site.  Daughter at bedside.  Assessment & Plan:   Principal Problem:   Closed left hip fracture (HCC)  Left femoral neck fracture.  Secondary to mechanical fall.  Going for left hemiarthroplasty with orthopedic surgery later today. -Continue with pain management -Postoperative PT/OT evaluation-recommending home health services  Nausea.  Might be postoperative. -Supportive care  Acute blood loss anemia.  With recent traumatic hip fracture and hemiarthroplasty.  Hemoglobin dropped to 10.9.  Anemia panel with some iron deficiency and anemia of chronic disease.  B12 still pending, folate within normal limit. -Start him on iron supplement -Monitor hemoglobin  Leukocytosis.  Most likely reactive due to stress margination as it has been resolved.  Objective: Vitals:   10/03/21 2052 10/04/21 0536 10/04/21 0851 10/04/21 1235  BP: (!) 123/55 (!) 109/52 (!) 110/49 (!) 121/54  Pulse: 90 65 75 76  Resp: 20 20 14 15   Temp: 99 F (37.2 C) 98.1 F (36.7 C) 98.6 F (37 C) 98.2 F (36.8 C)  TempSrc: Oral Oral    SpO2: 100% 100% 94% 93%  Weight:      Height:        Intake/Output Summary (Last 24 hours) at 10/04/2021 1434 Last data filed at 10/04/2021 0500 Gross per 24 hour  Intake 300 ml  Output 900  ml  Net -600 ml    Filed Weights   10/02/21 1753 10/03/21 0418  Weight: 66.8 kg 65.2 kg    Examination:  General.  Well-developed elderly lady, in no acute distress. Pulmonary.  Lungs clear bilaterally, normal respiratory effort. CV.  Regular rate and rhythm, no JVD, rub or murmur. Abdomen.  Soft, nontender, nondistended, BS positive. CNS.  Alert and oriented .  No focal neurologic deficit. Extremities.  No edema, no cyanosis, pulses intact and symmetrical. Psychiatry.  Judgment and insight appears normal.    DVT prophylaxis: Lovenox after the surgery Code Status: Full Family Communication: Discussed with daughter at bedside Disposition Plan:  Status is: Inpatient  Remains inpatient appropriate because:    Level of care: Med-Surg  All the records are reviewed and case discussed with Care Management/Social Worker. Management plans discussed with the patient, nursing and they are in agreement.  Consultants:  Orthopedic surgery  Procedures:  Antimicrobials:   Data Reviewed: I have personally reviewed following labs and imaging studies  CBC: Recent Labs  Lab 10/02/21 1922 10/03/21 0611 10/04/21 0324  WBC 11.2* 6.5 10.1  NEUTROABS 9.5*  --   --   HGB 14.1 12.4 10.9*  HCT 41.8 37.7 33.9*  MCV 92.3 93.5 93.1  PLT 141* 130* 110*    Basic Metabolic Panel: Recent Labs  Lab 10/02/21 1922 10/03/21 0611 10/04/21 0324  NA 138 138 134*  K 3.5 4.1 3.8  CL 104 109 105  CO2 24 26 23  GLUCOSE 125* 111* 139*  BUN 15 14 11   CREATININE 0.83 0.73 0.73  CALCIUM 9.3 8.3* 8.0*  MG 2.1  --   --     GFR: Estimated Creatinine Clearance: 49.6 mL/min (by C-G formula based on SCr of 0.73 mg/dL). Liver Function Tests: Recent Labs  Lab 10/03/21 0611  AST 19  ALT 15  ALKPHOS 41  BILITOT 1.1  PROT 5.9*  ALBUMIN 3.4*    No results for input(s): LIPASE, AMYLASE in the last 168 hours. No results for input(s): AMMONIA in the last 168 hours. Coagulation  Profile: Recent Labs  Lab 10/02/21 1922  INR 1.1    Cardiac Enzymes: No results for input(s): CKTOTAL, CKMB, CKMBINDEX, TROPONINI in the last 168 hours. BNP (last 3 results) No results for input(s): PROBNP in the last 8760 hours. HbA1C: No results for input(s): HGBA1C in the last 72 hours. CBG: No results for input(s): GLUCAP in the last 168 hours. Lipid Profile: No results for input(s): CHOL, HDL, LDLCALC, TRIG, CHOLHDL, LDLDIRECT in the last 72 hours. Thyroid Function Tests: No results for input(s): TSH, T4TOTAL, FREET4, T3FREE, THYROIDAB in the last 72 hours. Anemia Panel: Recent Labs    10/04/21 0324  FOLATE 10.5  FERRITIN 224  TIBC 197*  IRON 19*  RETICCTPCT 1.2   Sepsis Labs: No results for input(s): PROCALCITON, LATICACIDVEN in the last 168 hours.  Recent Results (from the past 240 hour(s))  Resp Panel by RT-PCR (Flu A&B, Covid) Nasopharyngeal Swab     Status: None   Collection Time: 10/02/21  7:22 PM   Specimen: Nasopharyngeal Swab; Nasopharyngeal(NP) swabs in vial transport medium  Result Value Ref Range Status   SARS Coronavirus 2 by RT PCR NEGATIVE NEGATIVE Final    Comment: (NOTE) SARS-CoV-2 target nucleic acids are NOT DETECTED.  The SARS-CoV-2 RNA is generally detectable in upper respiratory specimens during the acute phase of infection. The lowest concentration of SARS-CoV-2 viral copies this assay can detect is 138 copies/mL. A negative result does not preclude SARS-Cov-2 infection and should not be used as the sole basis for treatment or other patient management decisions. A negative result may occur with  improper specimen collection/handling, submission of specimen other than nasopharyngeal swab, presence of viral mutation(s) within the areas targeted by this assay, and inadequate number of viral copies(<138 copies/mL). A negative result must be combined with clinical observations, patient history, and epidemiological information. The expected  result is Negative.  Fact Sheet for Patients:  14/04/22  Fact Sheet for Healthcare Providers:  BloggerCourse.com  This test is no t yet approved or cleared by the [employer] FDA and  has been authorized for detection and/or diagnosis of SARS-CoV-2 by FDA under an Emergency Use Authorization (EUA). This EUA will remain  in effect (meaning this test can be used) for the duration of the COVID-19 declaration under Section 564(b)(1) of the Act, 21 U.S.C.section 360bbb-3(b)(1), unless the authorization is terminated  or revoked sooner.       Influenza A by PCR NEGATIVE NEGATIVE Final   Influenza B by PCR NEGATIVE NEGATIVE Final    Comment: (NOTE) The Xpert Xpress SARS-CoV-2/FLU/RSV plus assay is intended as an aid in the diagnosis of influenza from Nasopharyngeal swab specimens and should not be used as a sole basis for treatment. Nasal washings and aspirates are unacceptable for Xpert Xpress SARS-CoV-2/FLU/RSV testing.  Fact Sheet for Patients: [place]  Fact Sheet for Healthcare Providers: BloggerCourse.com  This test is not yet approved or cleared by the [employer]ious[employer]ker.it  FDA and has been authorized for detection and/or diagnosis of SARS-CoV-2 by FDA under an Emergency Use Authorization (EUA). This EUA will remain in effect (meaning this test can be used) for the duration of the COVID-19 declaration under Section 564(b)(1) of the Act, 21 U.S.C. section 360bbb-3(b)(1), unless the authorization is terminated or revoked.  Performed at [facility], [address] [place], [place] [address]       Radiology Studies: DG Chest 1 View  Result Date: 10/02/2021 CLINICAL DATA:  Recent fall with left hip pain and chest pain, initial encounter EXAM: CHEST  1 VIEW COMPARISON:  11/15/2020 FINDINGS: Cardiac shadow is at the upper limits of normal in size. Mild  aortic calcifications are noted. Lungs are hyperinflated bilaterally. No focal infiltrate is seen. Some pleural based apical density is noted on the right which may simply represent scarring although further evaluation is recommended. No acute bony abnormality is noted. Healed rib fractures on the left are noted. IMPRESSION: Mild pleural based apical density on the right. This may simply represent underlying scarring although the possibility of a more focal lesion would deserve consideration. Noncontrast CT may be helpful for further evaluation. Electronically Signed   By: Alcide Clever M.D.   On: 10/02/2021 19:46   DG HIP UNILAT WITH PELVIS 1V LEFT  Result Date: 10/03/2021 CLINICAL DATA:  Postoperative hip surgery EXAM: DG HIP (WITH OR WITHOUT PELVIS) 1V*L* COMPARISON:  10/02/2021 FINDINGS: Interval postsurgical changes from left hip hemiarthroplasty. Arthroplasty components are in their expected alignment. No periprosthetic fracture or evidence of other complication. Expected postoperative changes within the overlying soft tissues. IMPRESSION: Satisfactory postoperative appearance status post left hip hemiarthroplasty. Electronically Signed   By: Duanne Guess D.O.   On: 10/03/2021 16:43   DG Hip Unilat With Pelvis 2-3 Views Left  Result Date: 10/02/2021 CLINICAL DATA:  Status post fall.  Decreased range of motion. EXAM: DG HIP (WITH OR WITHOUT PELVIS) 2-3V LEFT COMPARISON:  None. FINDINGS: Displaced left subcapital femoral neck fracture with 15 mm of superior displacement. No other fracture or dislocation. Generalized osteopenia. Mild degenerative changes of the pubic symphysis. IMPRESSION: Acute displaced left subcapital femoral neck fracture with 15 mm of superior displacement. Electronically Signed   By: Elige Ko M.D.   On: 10/02/2021 19:45    Scheduled Meds:  docusate sodium  100 mg Oral BID   enoxaparin (LOVENOX) injection  30 mg Subcutaneous Q24H   ferrous fumarate-b12-vitamic C-folic  acid  1 capsule Oral BID PC   senna  1 tablet Oral BID   traMADol  50 mg Oral Q6H   Continuous Infusions:  sodium chloride Stopped (10/03/21 1529)   methocarbamol (ROBAXIN) IV     promethazine (PHENERGAN) injection (IM or IVPB)       LOS: 2 days   Time spent: 40 minutes. More than 50% of the time was spent in counseling/coordination of care  Arnetha Courser, MD [facility]  If 7PM-7AM, please contact night-coverage [email or web address]  10/04/2021, 2:34 PM   This record has been created using Conservation officer, historic buildings. Errors have been sought and corrected,but may not always be located. Such creation errors do not reflect on the standard of care.

## 2021-10-04 NOTE — Evaluation (Signed)
Physical Therapy Evaluation Patient Details Name: Connie Lee MRN: [id number] DOB: [birth year] Today's Date: 10/04/2021  History of Present Illness  Connie Lee is a 80 y.o. 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 L hip fx with L hip hemiarthroplasty performed on 12/5. WBAT and post hip precautions.   Clinical Impression  Pt admitted with above diagnosis. Pt received supine in bed agreeable to PT services. Able to report home lay out, PLOF, assist level at home at her disposal, and DME availability. Reports being independent with all ADL's/IADL's at baseline and lives with family members that are available 24/7 to assist as needed. Pt educated on post hip and Wb'ing precautions prior to mobility. To date, pt relies on HOB elevated and minA+1 at chuck pad and increased time to transfer to EOB with cues for LE/UE sequencing. Able to stand to RW with increased time and minA with cues for safe hand placement. Amb 2' with minguard and RW to recliner limited in mobility today due to reports of nausea and feeling "hot". Pt has significant weightbearing on RLE initially but with cuing she is able to equally WB on BLE's. Pt requires VC's for coordinating RW and steps with increased time to perform with decreased eccentric control into recliner. Anticipate pt will be safe to return home with subsiding of symptoms and displays ability to safely amb household distances and asc/desc 2-3 steps as pt has excellent family support available 24/7 and needed equipment for safe and successful mobility. Pt currently with functional limitations due to the deficits listed below (see PT Problem List). Pt will benefit from skilled PT to increase their independence and safety with mobility to allow discharge to the venue listed below.       Recommendations for follow up therapy are one component of a multi-disciplinary discharge planning process, led by the attending physician.  Recommendations may be updated based on patient status, additional functional criteria and insurance authorization.  Follow Up Recommendations Home health PT    Assistance Recommended at Discharge Frequent or constant Supervision/Assistance  Functional Status Assessment Patient has had a recent decline in their functional status and demonstrates the ability to make significant improvements in function in a reasonable and predictable amount of time.  Equipment Recommendations  Rolling walker (2 wheels);BSC/3in1    Recommendations for Other Services       Precautions / Restrictions Precautions Precautions: Posterior Hip Precaution Booklet Issued: No Restrictions Weight Bearing Restrictions: Yes LLE Weight Bearing: Weight bearing as tolerated      Mobility  Bed Mobility Overal bed mobility: Needs Assistance Bed Mobility: Supine to Sit     Supine to sit: HOB elevated;Min assist     General bed mobility comments: minA on chuck pad and cuing for LE and UE sequencing. Increased time to perform. Patient Response: Cooperative  Transfers Overall transfer level: Needs assistance Equipment used: Rolling walker (2 wheels) Transfers: Sit to/from Stand Sit to Stand: Min assist           General transfer comment: Increased time and cuing for hand placement.    Ambulation/Gait Ambulation/Gait assistance: Min guard Gait Distance (Feet): 2 Feet Assistive device: Rolling walker (2 wheels) Gait Pattern/deviations: Step-to pattern;Decreased stance time - left;Decreased step length - right;Shuffle       General Gait Details: Very slow moving with  limited foot clearance on L side. Inreased weightshift onto RLE to offload L hip  Stairs            Wheelchair Mobility    Modified Rankin (Stroke Patients Only)       Balance  Overall balance assessment: Needs assistance Sitting-balance support: No upper extremity supported;Feet supported Sitting balance-Leahy Scale: Fair     Standing balance support: During functional activity Standing balance-Leahy Scale: Fair Standing balance comment: Relies on UE's for support on RW                             Pertinent Vitals/Pain Pain Assessment: Faces Faces Pain Scale: Hurts a little bit Pain Location: L hip Pain Descriptors / Indicators: Guarding;Grimacing Pain Intervention(s): Monitored during session;Premedicated before session;Repositioned    Home Living Family/patient expects to be discharged to:: Private residence Living Arrangements: Children Available Help at Discharge: Available 24 hours/day Type of Home: House Home Access: Stairs to enter Entrance Stairs-Rails: None Entrance Stairs-Number of Steps: 2-3   Home Layout: One level Home Equipment: Agricultural consultant (2 wheels) (getting a hospital bed)      Prior Function Prior Level of Function : Independent/Modified Independent             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        Extremity/Trunk Assessment   Upper Extremity Assessment Upper Extremity Assessment: Generalized weakness    Lower Extremity Assessment Lower Extremity Assessment: Generalized weakness;LLE deficits/detail LLE Deficits / Details: L hemiarthroplasty of hip       Communication   Communication: No difficulties  Cognition Arousal/Alertness: Awake/alert Behavior During Therapy: WFL for tasks assessed/performed Overall Cognitive Status: Within Functional Limits for tasks assessed                                          General Comments General comments (skin integrity, edema, etc.): SPo2 92% on RA    Exercises Total Joint Exercises Ankle Circles/Pumps: AROM;Both;10 reps;Supine Quad Sets: Left;AROM;Strengthening;10 reps;Supine Gluteal Sets:  AROM;Supine;10 reps;Strengthening Hip ABduction/ADduction: AROM;Strengthening;Left;10 reps;Supine Other Exercises Other Exercises: Role of PT in acute setting, D/c recs, precautions, LE therex, DME   Assessment/Plan    PT Assessment Patient needs continued PT services  PT Problem List Decreased strength;Decreased range of motion;Decreased knowledge of use of DME;Decreased activity tolerance;Decreased balance;Pain;Decreased mobility       PT Treatment Interventions DME instruction;Balance training;Gait training;Neuromuscular re-education;Stair training;Functional mobility training;Patient/family education;Therapeutic activities;Therapeutic exercise    PT Goals (Current goals can be found in the Care Plan section)  Acute Rehab PT Goals Patient Stated Goal: to return home with family PT Goal Formulation: With patient/family Time For Goal Achievement: 10/18/21 Potential to Achieve Goals: Good    Frequency BID   Barriers to discharge        Co-evaluation               AM-PAC PT "6 Clicks" Mobility  Outcome Measure Help needed turning from your back to your side while in a flat bed without using bedrails?: A Lot Help needed moving from lying on your back to sitting on the side of a flat bed without using bedrails?: A Lot Help needed moving to and from a bed to a chair (including a wheelchair)?: A Little Help needed standing up from a chair using  your arms (e.g., wheelchair or bedside chair)?: A Little Help needed to walk in hospital room?: A Little Help needed climbing 3-5 steps with a railing? : A Lot 6 Click Score: 15    End of Session Equipment Utilized During Treatment: Gait belt Activity Tolerance: Treatment limited secondary to medical complications (Comment) (PT nauseous and having hot flashes with mobility) Patient left: in chair;with call bell/phone within reach;with chair alarm set;with family/visitor present Nurse Communication: Mobility status PT Visit  Diagnosis: Other abnormalities of gait and mobility (R26.89);Muscle weakness (generalized) (M62.81);History of falling (Z91.81);Difficulty in walking, not elsewhere classified (R26.2)    Time: 8756-4332 PT Time Calculation (min) (ACUTE ONLY): 29 min   Charges:   PT Evaluation $PT Eval Low Complexity: 1 Low PT Treatments $Therapeutic Exercise: 8-22 mins        Italia Wolfert M. Fairly IV, PT, DPT Physical Therapist- [place]  [facility]  10/04/2021, 10:43 AM

## 2021-10-04 NOTE — Progress Notes (Signed)
Subjective:  POD #1 s/p left hip hemiarthroplasty.   Patient reports left hip pain as mild to moderate.  Patient sitting up in bed eating dinner.  Her family is at the bedside.  Patient was able to get up out of bed to a chair today with physical therapy.  Objective:   VITALS:   Vitals:   10/04/21 0536 10/04/21 0851 10/04/21 1235 10/04/21 1737  BP: (!) 109/52 (!) 110/49 (!) 121/54 (!) 111/53  Pulse: 65 75 76 87  Resp: 20 14 15 16   Temp: 98.1 F (36.7 C) 98.6 F (37 C) 98.2 F (36.8 C) 98.1 F (36.7 C)  TempSrc: Oral     SpO2: 100% 94% 93% 95%  Weight:      Height:        PHYSICAL EXAM: Left lower extremity Neurovascular intact Sensation intact distally Intact pulses distally Dorsiflexion/Plantar flexion intact Incision: dressing C/D/I No cellulitis present Compartment soft  LABS  Results for orders placed or performed during the hospital encounter of 10/02/21 (from the past 24 hour(s))  CBC     Status: Abnormal   Collection Time: 10/04/21  3:24 AM  Result Value Ref Range   WBC 10.1 4.0 - 10.5 K/uL   RBC 3.64 (L) 3.87 - 5.11 MIL/uL   Hemoglobin 10.9 (L) 12.0 - 15.0 g/dL   HCT 14/06/22 (L) 43.1 - 54.0 %   MCV 93.1 80.0 - 100.0 fL   MCH 29.9 26.0 - 34.0 pg   MCHC 32.2 30.0 - 36.0 g/dL   RDW 08.6 76.1 - 95.0 %   Platelets 110 (L) 150 - 400 K/uL   nRBC 0.0 0.0 - 0.2 %  Basic metabolic panel     Status: Abnormal   Collection Time: 10/04/21  3:24 AM  Result Value Ref Range   Sodium 134 (L) 135 - 145 mmol/L   Potassium 3.8 3.5 - 5.1 mmol/L   Chloride 105 98 - 111 mmol/L   CO2 23 22 - 32 mmol/L   Glucose, Bld 139 (H) 70 - 99 mg/dL   BUN 11 8 - 23 mg/dL   Creatinine, Ser 14/06/22 0.44 - 1.00 mg/dL   Calcium 8.0 (L) 8.9 - 10.3 mg/dL   GFR, Estimated 6.71 >24 mL/min   Anion gap 6 5 - 15  Folate     Status: None   Collection Time: 10/04/21  3:24 AM  Result Value Ref Range   Folate 10.5 >5.9 ng/mL  Iron and TIBC     Status: Abnormal   Collection Time: 10/04/21  3:24 AM   Result Value Ref Range   Iron 19 (L) 28 - 170 ug/dL   TIBC 14/06/22 (L) 099 - 833 ug/dL   Saturation Ratios 10 (L) 10.4 - 31.8 %   UIBC 178 ug/dL  Ferritin     Status: None   Collection Time: 10/04/21  3:24 AM  Result Value Ref Range   Ferritin 224 11 - 307 ng/mL  Reticulocytes     Status: Abnormal   Collection Time: 10/04/21  3:24 AM  Result Value Ref Range   Retic Ct Pct 1.2 0.4 - 3.1 %   RBC. 3.61 (L) 3.87 - 5.11 MIL/uL   Retic Count, Absolute 42.6 19.0 - 186.0 K/uL   Immature Retic Fract 6.3 2.3 - 15.9 %  Vitamin B12     Status: None   Collection Time: 10/04/21  9:45 AM  Result Value Ref Range   Vitamin B-12 263 180 - 914 pg/mL    DG  Chest 1 View  Result Date: 10/02/2021 CLINICAL DATA:  Recent fall with left hip pain and chest pain, initial encounter EXAM: CHEST  1 VIEW COMPARISON:  11/15/2020 FINDINGS: Cardiac shadow is at the upper limits of normal in size. Mild aortic calcifications are noted. Lungs are hyperinflated bilaterally. No focal infiltrate is seen. Some pleural based apical density is noted on the right which may simply represent scarring although further evaluation is recommended. No acute bony abnormality is noted. Healed rib fractures on the left are noted. IMPRESSION: Mild pleural based apical density on the right. This may simply represent underlying scarring although the possibility of a more focal lesion would deserve consideration. Noncontrast CT may be helpful for further evaluation. Electronically Signed   By: Inez Catalina M.D.   On: 10/02/2021 19:46   DG HIP UNILAT WITH PELVIS 1V LEFT  Result Date: 10/03/2021 CLINICAL DATA:  Postoperative hip surgery EXAM: DG HIP (WITH OR WITHOUT PELVIS) 1V*L* COMPARISON:  10/02/2021 FINDINGS: Interval postsurgical changes from left hip hemiarthroplasty. Arthroplasty components are in their expected alignment. No periprosthetic fracture or evidence of other complication. Expected postoperative changes within the overlying soft  tissues. IMPRESSION: Satisfactory postoperative appearance status post left hip hemiarthroplasty. Electronically Signed   By: Davina Poke D.O.   On: 10/03/2021 16:43   DG Hip Unilat With Pelvis 2-3 Views Left  Result Date: 10/02/2021 CLINICAL DATA:  Status post fall.  Decreased range of motion. EXAM: DG HIP (WITH OR WITHOUT PELVIS) 2-3V LEFT COMPARISON:  None. FINDINGS: Displaced left subcapital femoral neck fracture with 15 mm of superior displacement. No other fracture or dislocation. Generalized osteopenia. Mild degenerative changes of the pubic symphysis. IMPRESSION: Acute displaced left subcapital femoral neck fracture with 15 mm of superior displacement. Electronically Signed   By: Kathreen Devoid M.D.   On: 10/02/2021 19:45    Assessment/Plan: 1 Day Post-Op   Principal Problem:   Closed left hip fracture [facility])  Patient is making good progress with physical therapy.  She is doing well on postop day #1.  She is neurovascular intact and her hemoglobin hematocrit are within acceptable limits.  Continue with physical therapy.  Continue Lovenox for DVT prophylaxis.  Patient is weightbearing as tolerated in left lower extremity with posterior hip precautions.    Thornton Park , MD 10/04/2021, 6:07 PM

## 2021-10-04 NOTE — Progress Notes (Signed)
Met with the Patient and her family at the bedside, she has Dme at home including a Rw and a 3 in1 She does not needs additional DME She is agreeable to [facility] and is set up with [facility] They will reach out to her daughter Olivia Mackie per her request She has transportation with her family, She can afford her medication No additional needs

## 2021-10-04 NOTE — Evaluation (Signed)
Occupational Therapy Evaluation Patient Details Name: Connie Lee MRN: [id number] DOB: 04/24/1941 Today's Date: 10/04/2021   History of Present Illness ALEASE FAIT is a 80 y.o. 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 L hip fx with L hip hemiarthroplasty performed on 12/5. WBAT and post hip precautions.   Clinical Impression   Patient presenting with decreased Ind in self care, balance, functional mobility/transfers, endurance, and safety awareness. Patient reports being ind at baseline without use of AD PTA. She is very independent with self care and IADLs.  Patient currently becoming very nauseated and reports feeling "hot all over" when moving self forward in recliner chair. Pt needing max A to stand from recliner chair and mod a for stand step pivot back to bed. Pt needing assistance managing RW and max multimodal cuing for sequencing/technique. Pt having difficulty putting any weight through L LE in sitting and in standing. Unable to offload weight to take step and instead scoots foot on floor.Patient will benefit from acute OT to increase overall independence in the areas of ADLs, functional mobility, and safety awareness in order to safely discharge to next venue of care.      Recommendations for follow up therapy are one component of a multi-disciplinary discharge planning process, led by the attending physician.  Recommendations may be updated based on patient status, additional functional criteria and insurance authorization.   Follow Up Recommendations  Skilled nursing-short term rehab (<3 hours/day)    Assistance Recommended at Discharge Frequent or constant Supervision/Assistance  Functional Status Assessment  Patient has had a recent decline in their functional status and demonstrates the ability to make  significant improvements in function in a reasonable and predictable amount of time.  Equipment Recommendations  Other (comment) (Pt has all needed equipment)       Precautions / Restrictions Precautions Precautions: Posterior Hip Precaution Booklet Issued: No Restrictions Weight Bearing Restrictions: Yes LLE Weight Bearing: Weight bearing as tolerated      Mobility Bed Mobility Overal bed mobility: Needs Assistance Bed Mobility: Sit to Supine     Supine to sit: HOB elevated;Min assist Sit to supine: Max assist   General bed mobility comments: Pt very nauseated    Transfers Overall transfer level: Needs assistance Equipment used: Rolling walker (2 wheels) Transfers: Sit to/from Stand;Bed to chair/wheelchair/BSC (simulated BSC transfer) Sit to Stand: Max assist     Step pivot transfers: Mod assist     General transfer comment: assistance for advancement of RW and max multimodal cuing for sequencing      Balance Overall balance assessment: Needs assistance Sitting-balance support: No upper extremity supported;Feet supported Sitting balance-Leahy Scale: Fair     Standing balance support: During functional activity Standing balance-Leahy Scale: Fair Standing balance comment: Relies on UE's for support on RW                           ADL either performed or assessed with clinical judgement   ADL Overall ADL's : Needs assistance/impaired     Grooming: Wash/dry hands;Wash/dry face;Sitting;Set up                   Toilet Transfer: Moderate assistance;Rolling walker (2 wheels) Toilet Transfer Details (indicate cue type and reason): simulated  Vision Baseline Vision/History: 1 Wears glasses Ability to See in Adequate Light: 0 Adequate Patient Visual Report: No change from baseline              Pertinent Vitals/Pain Pain Assessment: 0-10 Pain Score: 7  Faces Pain Scale: Hurts a little bit Pain Location: L hip Pain  Descriptors / Indicators: Guarding;Grimacing Pain Intervention(s): Limited activity within patient's tolerance;Monitored during session;Premedicated before session;Repositioned     Hand Dominance Right   Extremity/Trunk Assessment Upper Extremity Assessment Upper Extremity Assessment: Generalized weakness   Lower Extremity Assessment Lower Extremity Assessment: Generalized weakness;LLE deficits/detail LLE Deficits / Details: L hemiarthroplasty of hip       Communication Communication Communication: No difficulties   Cognition Arousal/Alertness: Awake/alert Behavior During Therapy: WFL for tasks assessed/performed Overall Cognitive Status: Within Functional Limits for tasks assessed                                       General Comments  SPo2 92% on RA    Exercises Total Joint Exercises Ankle Circles/Pumps: AROM;Both;10 reps;Supine Quad Sets: Left;AROM;Strengthening;10 reps;Supine Gluteal Sets: AROM;Supine;10 reps;Strengthening Hip ABduction/ADduction: AROM;Strengthening;Left;10 reps;Supine Other Exercises Other Exercises: Role of PT in acute setting, D/c recs, precautions, LE therex, DME        Home Living Family/patient expects to be discharged to:: Private residence Living Arrangements: Children Available Help at Discharge: Available 24 hours/day Type of Home: House Home Access: Stairs to enter [employer] of Steps: 2-3 Entrance Stairs-Rails: None Home Layout: One level     Bathroom Shower/Tub: Chief Strategy Officer: Handicapped height     Home Equipment: Agricultural consultant (2 wheels);Shower seat;BSC/3in1          Prior Functioning/Environment Prior Level of Function : Independent/Modified Independent             Mobility Comments: Indep working on farm ADLs Comments: Pt lives with several family members and 3 foster children. She is Ind at baseline with self care and IADL tasks. She drives.        OT  Problem List: Decreased strength;Decreased activity tolerance;Impaired balance (sitting and/or standing);Decreased safety awareness;Pain;Decreased knowledge of use of DME or AE      OT Treatment/Interventions: Self-care/ADL training;Manual therapy;Therapeutic exercise;Patient/family education;Energy conservation;DME and/or AE instruction;Therapeutic activities;Balance training    OT Goals(Current goals can be found in the care plan section) Acute Rehab OT Goals Patient Stated Goal: to return home OT Goal Formulation: With patient/family Time For Goal Achievement: 10/18/21 Potential to Achieve Goals: Good ADL Goals Pt Will Perform Grooming: with supervision;standing Pt Will Perform Lower Body Dressing: with supervision;with adaptive equipment;sit to/from stand Pt Will Transfer to Toilet: with supervision;ambulating Pt Will Perform Toileting - Clothing Manipulation and hygiene: with supervision;sit to/from stand  OT Frequency: Min 2X/week   Barriers to D/C: Other (comment)  pt has family support          AM-PAC OT "6 Clicks" Daily Activity     Outcome Measure Help from another person eating meals?: None Help from another person taking care of personal grooming?: None Help from another person toileting, which includes using toliet, bedpan, or urinal?: A Lot Help from another person bathing (including washing, rinsing, drying)?: A Lot Help from another person to put on and taking off regular upper body clothing?: A Little Help from another person to put on and taking off regular lower body clothing?: A Lot 6 Click Score: 17  End of Session Equipment Utilized During Treatment: Rolling walker (2 wheels) Nurse Communication: Mobility status  Activity Tolerance: Patient limited by pain;Other (comment) (nausea) Patient left: in bed;with call bell/phone within reach;with bed alarm set;with family/visitor present  OT Visit Diagnosis: Unsteadiness on feet (R26.81);Repeated falls  (R29.6);Muscle weakness (generalized) (M62.81)                Time: 1050-1120 OT Time Calculation (min): 30 min Charges:  OT General Charges $OT Visit: 1 Visit OT Evaluation $OT Eval Moderate Complexity: 1 Mod OT Treatments $Self Care/Home Management : 8-22 mins $Therapeutic Activity: 8-22 mins  Jackquline Denmark, MS, OTR/L , CBIS ascom [phone]  10/04/21, 1:27 PM

## 2021-10-05 ENCOUNTER — Telehealth: Payer: Self-pay

## 2021-10-05 LAB — CBC
HCT: 30.5 % — ABNORMAL LOW (ref 36.0–46.0)
Hemoglobin: 10.3 g/dL — ABNORMAL LOW (ref 12.0–15.0)
MCH: 31.6 pg (ref 26.0–34.0)
MCHC: 33.8 g/dL (ref 30.0–36.0)
MCV: 93.6 fL (ref 80.0–100.0)
Platelets: 110 10*3/uL — ABNORMAL LOW (ref 150–400)
RBC: 3.26 MIL/uL — ABNORMAL LOW (ref 3.87–5.11)
RDW: 12.6 % (ref 11.5–15.5)
WBC: 11.6 10*3/uL — ABNORMAL HIGH (ref 4.0–10.5)
nRBC: 0 % (ref 0.0–0.2)

## 2021-10-05 LAB — VITAMIN D 25 HYDROXY (VIT D DEFICIENCY, FRACTURES): Vit D, 25-Hydroxy: 20.67 ng/mL — ABNORMAL LOW (ref 30–100)

## 2021-10-05 LAB — SURGICAL PATHOLOGY

## 2021-10-05 MED ORDER — OYSTER SHELL CALCIUM/D3 500-5 MG-MCG PO TABS
1.0000 | ORAL_TABLET | Freq: Two times a day (BID) | ORAL | Status: DC
  Administered 2021-10-05 – 2021-10-06 (×3): 1 via ORAL
  Filled 2021-10-05 (×3): qty 1

## 2021-10-05 MED ORDER — VITAMIN B-12 1000 MCG PO TABS
1000.0000 ug | ORAL_TABLET | Freq: Every day | ORAL | Status: DC
  Administered 2021-10-05 – 2021-10-06 (×2): 1000 ug via ORAL
  Filled 2021-10-05 (×2): qty 1

## 2021-10-05 NOTE — Progress Notes (Signed)
PROGRESS NOTE    Connie Lee  QKM:[id number] DOB: [birth year] DOA: 10/02/2021 PCP: Juline Patch, MD   Brief Narrative: Taken from H&P. Connie Lee is a 80 y.o. Caucasian female with no significant medical history, who presented to the ER with acute onset of left hip pain after an accidental mechanical fall after tripping over on her animals.  Found to have a displaced left femoral neck fracture. Orthopedic was consulted and she was taken to the OR on 10/03/2021 for hemiarthroplasty-tolerated the procedure well.  PT is recommending home health as patient also wants to go home.  Subjective: Patient was seen and examined today.  She was experiencing again some nausea with the change in position.  Pain increases with movement, well-controlled at rest.  Assessment & Plan:   Principal Problem:   Closed left hip fracture (HCC)  Left femoral neck fracture.  Secondary to mechanical fall.  Going for left hemiarthroplasty with orthopedic surgery later today. -Continue with pain management -Postoperative PT/OT evaluation-recommending home health services -Due to some nausea and family is trying to install some railing today we will keep patient for another day and hopefully discharge tomorrow. -She will go on 2 weeks of Lovenox for DVT prophylaxis.  Nausea.  Might be postoperative.  Orthostatic vitals were negative -Supportive care  Osteopenia.  Patient met criteria for osteopenia due to hip fracture with ground-level fall.  No prior DEXA scan. -Discussed with patient and daughter to talk with her PCP as she will get benefit from DEXA scan and bisphosphonate which should be started at least 6-week after this procedure. -Check vitamin D level -Start her on calcium and vitamin D supplement  Acute blood loss anemia.  With recent traumatic hip fracture and hemiarthroplasty.  Hemoglobin dropped to 10.9>>10.3.  Anemia panel with some iron deficiency and anemia of chronic disease.  B12  263, folate within normal limit. -Continue iron supplement -Start B12 supplement as levels are within lower normal limit, ideally should be above 400. -Monitor hemoglobin  Leukocytosis.  Most likely reactive due to stress margination as it has been resolved.  Objective: Vitals:   10/05/21 0428 10/05/21 0815 10/05/21 1111 10/05/21 1217  BP: (!) 116/48 (!) 122/49  (!) 123/52  Pulse: 78 94  69  Resp: 20 17  16   Temp: 98.7 F (37.1 C) 98.6 F (37 C)  98.2 F (36.8 C)  TempSrc:    Oral  SpO2: 94% 92% 91% 96%  Weight:      Height:        Intake/Output Summary (Last 24 hours) at 10/05/2021 1402 Last data filed at 10/04/2021 1540 Gross per 24 hour  Intake --  Output 100 ml  Net -100 ml    Filed Weights   10/02/21 1753 10/03/21 0418  Weight: 66.8 kg 65.2 kg    Examination:  General.  Well-developed lady, in no acute distress. Pulmonary.  Lungs clear bilaterally, normal respiratory effort. CV.  Regular rate and rhythm, no JVD, rub or murmur. Abdomen.  Soft, nontender, nondistended, BS positive. CNS.  Alert and oriented .  No focal neurologic deficit. Extremities.  No edema, no cyanosis, pulses intact and symmetrical. Psychiatry.  Judgment and insight appears normal.   DVT prophylaxis: Lovenox after the surgery Code Status: Full Family Communication: Discussed with daughter at bedside Disposition Plan:  Status is: Inpatient  Remains inpatient appropriate because:    Level of care: Med-Surg  All the records are reviewed and case discussed with Care Management/Social Worker. Management plans discussed  with the patient, nursing and they are in agreement.  Consultants:  Orthopedic surgery  Procedures:  Antimicrobials:   Data Reviewed: I have personally reviewed following labs and imaging studies  CBC: Recent Labs  Lab 10/02/21 1922 10/03/21 0611 10/04/21 0324 10/05/21 0230  WBC 11.2* 6.5 10.1 11.6*  NEUTROABS 9.5*  --   --   --   HGB 14.1 12.4 10.9* 10.3*   HCT 41.8 37.7 33.9* 30.5*  MCV 92.3 93.5 93.1 93.6  PLT 141* 130* 110* 110*    Basic Metabolic Panel: Recent Labs  Lab 10/02/21 1922 10/03/21 0611 10/04/21 0324  NA 138 138 134*  K 3.5 4.1 3.8  CL 104 109 105  CO2 24 26 23   GLUCOSE 125* 111* 139*  BUN 15 14 11   CREATININE 0.83 0.73 0.73  CALCIUM 9.3 8.3* 8.0*  MG 2.1  --   --     GFR: Estimated Creatinine Clearance: 49.6 mL/min (by C-G formula based on SCr of 0.73 mg/dL). Liver Function Tests: Recent Labs  Lab 10/03/21 0611  AST 19  ALT 15  ALKPHOS 41  BILITOT 1.1  PROT 5.9*  ALBUMIN 3.4*    No results for input(s): LIPASE, AMYLASE in the last 168 hours. No results for input(s): AMMONIA in the last 168 hours. Coagulation Profile: Recent Labs  Lab 10/02/21 1922  INR 1.1    Cardiac Enzymes: No results for input(s): CKTOTAL, CKMB, CKMBINDEX, TROPONINI in the last 168 hours. BNP (last 3 results) No results for input(s): PROBNP in the last 8760 hours. HbA1C: No results for input(s): HGBA1C in the last 72 hours. CBG: No results for input(s): GLUCAP in the last 168 hours. Lipid Profile: No results for input(s): CHOL, HDL, LDLCALC, TRIG, CHOLHDL, LDLDIRECT in the last 72 hours. Thyroid Function Tests: No results for input(s): TSH, T4TOTAL, FREET4, T3FREE, THYROIDAB in the last 72 hours. Anemia Panel: Recent Labs    10/04/21 0324 10/04/21 0945  VITAMINB12  --  263  FOLATE 10.5  --   FERRITIN 224  --   TIBC 197*  --   IRON 19*  --   RETICCTPCT 1.2  --     Sepsis Labs: No results for input(s): PROCALCITON, LATICACIDVEN in the last 168 hours.  Recent Results (from the past 240 hour(s))  Resp Panel by RT-PCR (Flu A&B, Covid) Nasopharyngeal Swab     Status: None   Collection Time: 10/02/21  7:22 PM   Specimen: Nasopharyngeal Swab; Nasopharyngeal(NP) swabs in vial transport medium  Result Value Ref Range Status   SARS Coronavirus 2 by RT PCR NEGATIVE NEGATIVE Final    Comment: (NOTE) SARS-CoV-2  target nucleic acids are NOT DETECTED.  The SARS-CoV-2 RNA is generally detectable in upper respiratory specimens during the acute phase of infection. The lowest concentration of SARS-CoV-2 viral copies this assay can detect is 138 copies/mL. A negative result does not preclude SARS-Cov-2 infection and should not be used as the sole basis for treatment or other patient management decisions. A negative result may occur with  improper specimen collection/handling, submission of specimen other than nasopharyngeal swab, presence of viral mutation(s) within the areas targeted by this assay, and inadequate number of viral copies(<138 copies/mL). A negative result must be combined with clinical observations, patient history, and epidemiological information. The expected result is Negative.  Fact Sheet for Patients:  EntrepreneurPulse.com.au  Fact Sheet for Healthcare Providers:  IncredibleEmployment.be  This test is no t yet approved or cleared by the [place] FDA and  has been  authorized for detection and/or diagnosis of SARS-CoV-2 by FDA under an Emergency Use Authorization (EUA). This EUA will remain  in effect (meaning this test can be used) for the duration of the COVID-19 declaration under Section 564(b)(1) of the Act, 21 U.S.C.section 360bbb-3(b)(1), unless the authorization is terminated  or revoked sooner.       Influenza A by PCR NEGATIVE NEGATIVE Final   Influenza B by PCR NEGATIVE NEGATIVE Final    Comment: (NOTE) The Xpert Xpress SARS-CoV-2/FLU/RSV plus assay is intended as an aid in the diagnosis of influenza from Nasopharyngeal swab specimens and should not be used as a sole basis for treatment. Nasal washings and aspirates are unacceptable for Xpert Xpress SARS-CoV-2/FLU/RSV testing.  Fact Sheet for Patients: EntrepreneurPulse.com.au  Fact Sheet for Healthcare  Providers: IncredibleEmployment.be  This test is not yet approved or cleared by the [place] FDA and has been authorized for detection and/or diagnosis of SARS-CoV-2 by FDA under an Emergency Use Authorization (EUA). This EUA will remain in effect (meaning this test can be used) for the duration of the COVID-19 declaration under Section 564(b)(1) of the Act, 21 U.S.C. section 360bbb-3(b)(1), unless the authorization is terminated or revoked.  Performed at [facility], [address] [place], [place] [address]       Radiology Studies: DG HIP UNILAT WITH PELVIS 1V LEFT  Result Date: 10/03/2021 CLINICAL DATA:  Postoperative hip surgery EXAM: DG HIP (WITH OR WITHOUT PELVIS) 1V*L* COMPARISON:  10/02/2021 FINDINGS: Interval postsurgical changes from left hip hemiarthroplasty. Arthroplasty components are in their expected alignment. No periprosthetic fracture or evidence of other complication. Expected postoperative changes within the overlying soft tissues. IMPRESSION: Satisfactory postoperative appearance status post left hip hemiarthroplasty. Electronically Signed   By: Davina Poke D.O.   On: 10/03/2021 16:43    Scheduled Meds:  calcium-vitamin D  1 tablet Oral BID   docusate sodium  100 mg Oral BID   enoxaparin (LOVENOX) injection  30 mg Subcutaneous Q24H   ferrous [id number] C-folic acid  1 capsule Oral BID PC   senna  1 tablet Oral BID   traMADol  50 mg Oral Q6H   vitamin B-12  1,000 mcg Oral Daily   Continuous Infusions:  sodium chloride Stopped (10/03/21 1529)   methocarbamol (ROBAXIN) IV     promethazine (PHENERGAN) injection (IM or IVPB)       LOS: 3 days   Time spent: 38 minutes. More than 50% of the time was spent in counseling/coordination of care  Lorella Nimrod, MD [facility]  If 7PM-7AM, please contact night-coverage [email or web address]  10/05/2021, 2:02 PM   This record has been created using Therapist, sports. Errors have been sought and corrected,but may not always be located. Such creation errors do not reflect on the standard of care.

## 2021-10-05 NOTE — Telephone Encounter (Signed)
Copied from CRM [id number]. Topic: General - Other >> Oct 05, 2021 11:11 AM Wyonia Hough E wrote: Reason for CRM: [facility] wanted to know if Dr. Yetta Barre would sign home health orders for the pt / please advise

## 2021-10-05 NOTE — Progress Notes (Signed)
Physical Therapy Treatment Patient Details Name: Connie Lee MRN: [id number] DOB: August 16, 1941 Today's Date: 10/05/2021   History of Present Illness Connie Lee is a 80 y.o. 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 L hip fx with L hip hemiarthroplasty performed on 12/5. WBAT and post hip precautions.    PT Comments    Pt received supine in bed agreeable to PT services. Remains supervision and increased time to reach EoB. Able to stand with supervision to RW. Stand-step transfer to recliner and transported to stairs for further stair training. Pt able to repeat safe progression of asc/desc stairs prior to performing and able to perform with minguard with safe technique. X1 needed bout of cues for UE sequencing. Pt progressed ambulation with RW with supervision from PT gym to room (~140') with chair follow with no need for standing or seated rest breaks progressing almost to full, step through pattern. Pt returned to room and supervision to return to supine in bed. Pt still relies on minimal VC's for minor sequencing with RW along with keeping LLE out in front when sitting to help maintain hip flexion precautions. D/c recs remain appropriate at this time.     Recommendations for follow up therapy are one component of a multi-disciplinary discharge planning process, led by the attending physician.  Recommendations may be updated based on patient status, additional functional criteria and insurance authorization.  Follow Up Recommendations  Home health PT     Assistance Recommended at Discharge Frequent or constant Supervision/Assistance  Equipment Recommendations  Rolling walker (2 wheels);BSC/3in1    Recommendations for Other Services       Precautions / Restrictions Precautions Precautions: Posterior  Hip Precaution Booklet Issued: Yes (comment) Restrictions Weight Bearing Restrictions: Yes LLE Weight Bearing: Weight bearing as tolerated     Mobility  Bed Mobility Overal bed mobility: Needs Assistance Bed Mobility: Supine to Sit;Sit to Supine     Supine to sit: HOB elevated;Supervision Sit to supine: Supervision   General bed mobility comments: Requires increased time but moves quicker than yesterday. x1 VC's for UE/Le positioning/sequencing Patient Response: Cooperative  Transfers Overall transfer level: Needs assistance Equipment used: Rolling walker (2 wheels) Transfers: Sit to/from Stand Sit to Stand: Min guard           General transfer comment: min VC's for sequencing RW with transfers.    Ambulation/Gait Ambulation/Gait assistance: Supervision Gait Distance (Feet): 140 Feet Assistive device: Rolling walker (2 wheels) Gait Pattern/deviations: Step-to pattern;Decreased stance time - left;Decreased step length - right;Step-through pattern       General Gait Details: Able to further progress almost to full, step through pattern. Still slightly antalgic limiting R foot fully progressing past L foot.   Stairs Stairs: Yes Stairs assistance: Min guard Stair Management: One rail Left;Step to pattern;Sideways Number of Stairs: 2 General stair comments: x1 instance of cuing for Ue sequencing.   Wheelchair Mobility    Modified Rankin (Stroke Patients Only)       Balance Overall balance assessment: Needs assistance Sitting-balance support: No upper extremity supported;Feet supported Sitting balance-Leahy Scale: Fair     Standing balance support: During functional activity Standing balance-Leahy Scale: Fair Standing balance comment: Relies on UE's for support on RW  Cognition Arousal/Alertness: Awake/alert Behavior During Therapy: WFL for tasks assessed/performed Overall Cognitive Status: Within Functional Limits  for tasks assessed                                          Exercises Total Joint Exercises Ankle Circles/Pumps: AROM;Both;10 reps;Supine Quad Sets: Left;AROM;Strengthening;10 reps;Supine Gluteal Sets: AROM;Supine;10 reps;Strengthening Heel Slides: AROM;Supine;10 reps;Left;Strengthening Hip ABduction/ADduction: AROM;Strengthening;Left;10 reps;Supine Long Arc Quad: AROM;Seated;Both;10 reps    General Comments General comments (skin integrity, edema, etc.): SPo2 89-92%      Pertinent Vitals/Pain Pain Assessment: 0-10 Pain Score: 2  Pain Location: L hip Pain Descriptors / Indicators: Guarding;Grimacing Pain Intervention(s): Limited activity within patient's tolerance;Monitored during session;Repositioned    Home Living                          Prior Function            PT Goals (current goals can now be found in the care plan section) Acute Rehab PT Goals Patient Stated Goal: to return home with family PT Goal Formulation: With patient/family Time For Goal Achievement: 10/18/21 Potential to Achieve Goals: Good Progress towards PT goals: Progressing toward goals    Frequency    BID      PT Plan Current plan remains appropriate    Co-evaluation              AM-PAC PT "6 Clicks" Mobility   Outcome Measure  Help needed turning from your back to your side while in a flat bed without using bedrails?: A Lot Help needed moving from lying on your back to sitting on the side of a flat bed without using bedrails?: A Lot Help needed moving to and from a bed to a chair (including a wheelchair)?: A Little Help needed standing up from a chair using your arms (e.g., wheelchair or bedside chair)?: A Little Help needed to walk in hospital room?: A Little Help needed climbing 3-5 steps with a railing? : A Little 6 Click Score: 16    End of Session Equipment Utilized During Treatment: Gait belt Activity Tolerance: Patient tolerated treatment  well Patient left: in bed;with call bell/phone within reach;with family/visitor present Nurse Communication: Mobility status PT Visit Diagnosis: Other abnormalities of gait and mobility (R26.89);Muscle weakness (generalized) (M62.81);History of falling (Z91.81);Difficulty in walking, not elsewhere classified (R26.2)     Time: 9977-4142 PT Time Calculation (min) (ACUTE ONLY): 53 min  Charges:  $Gait Training: 53-67 mins $Therapeutic Exercise: 8-22 mins                      Eraina Winnie M. Fairly IV, PT, DPT Physical Therapist-   [facility]  10/05/2021, 2:32 PM

## 2021-10-05 NOTE — Plan of Care (Signed)

## 2021-10-05 NOTE — Progress Notes (Signed)
  Subjective:  POD #2  s/p left hip hemiarthroplasty.   Patient reports left hip pain as mild to moderate.  Patient getting back into bed with her physical therapist during my evaluation.  Patient's pastor and a female family member are also in the room.  Patient has no acute complaints.  Objective:   VITALS:   Vitals:   10/05/21 0428 10/05/21 0815 10/05/21 1111 10/05/21 1217  BP: (!) 116/48 (!) 122/49  (!) 123/52  Pulse: 78 94  69  Resp: 20 17  16   Temp: 98.7 F (37.1 C) 98.6 F (37 C)  98.2 F (36.8 C)  TempSrc:    Oral  SpO2: 94% 92% 91% 96%  Weight:      Height:        PHYSICAL EXAM: Left lower extremity Neurovascular intact Sensation intact distally Intact pulses distally Dorsiflexion/Plantar flexion intact Incision: dressing C/D/I No cellulitis present Compartment soft  LABS  Results for orders placed or performed during the hospital encounter of 10/02/21 (from the past 24 hour(s))  CBC     Status: Abnormal   Collection Time: 10/05/21  2:30 AM  Result Value Ref Range   WBC 11.6 (H) 4.0 - 10.5 K/uL   RBC 3.26 (L) 3.87 - 5.11 MIL/uL   Hemoglobin 10.3 (L) 12.0 - 15.0 g/dL   HCT 14/07/22 (L) 10.3 - 01.3 %   MCV 93.6 80.0 - 100.0 fL   MCH 31.6 26.0 - 34.0 pg   MCHC 33.8 30.0 - 36.0 g/dL   RDW 14.3 88.8 - 75.7 %   Platelets 110 (L) 150 - 400 K/uL   nRBC 0.0 0.0 - 0.2 %  VITAMIN D 25 Hydroxy (Vit-D Deficiency, Fractures)     Status: Abnormal   Collection Time: 10/05/21  2:31 AM  Result Value Ref Range   Vit D, 25-Hydroxy 20.67 (L) 30 - 100 ng/mL    DG HIP UNILAT WITH PELVIS 1V LEFT  Result Date: 10/03/2021 CLINICAL DATA:  Postoperative hip surgery EXAM: DG HIP (WITH OR WITHOUT PELVIS) 1V*L* COMPARISON:  10/02/2021 FINDINGS: Interval postsurgical changes from left hip hemiarthroplasty. Arthroplasty components are in their expected alignment. No periprosthetic fracture or evidence of other complication. Expected postoperative changes within the overlying soft tissues.  IMPRESSION: Satisfactory postoperative appearance status post left hip hemiarthroplasty. Electronically Signed   By: 14/01/2021 D.O.   On: 10/03/2021 16:43    Assessment/Plan: 2 Days Post-Op   Principal Problem:   Closed left hip fracture [facility])  Patient making expected progress with physical therapy.  Continue PT.  Continue posterior hip precautions.  Patient is weightbearing as tolerated.  Patient will continue either Lovenox 30 mg daily or enteric-coated aspirin 325 mg daily for DVT prophylaxis until follow-up in the office.  Patient will follow up at [facility] in [place] in 10 to 14 days for wound check, staple removal and x-ray.    [facility] , MD 10/05/2021, 2:20 PM

## 2021-10-05 NOTE — Progress Notes (Signed)
Physical Therapy Treatment Patient Details Name: Connie Lee MRN: [id number] DOB: 22-Jan-1941 Today's Date: 10/05/2021   History of Present Illness Connie Lee is a 80 y.o. 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 L hip fx with L hip hemiarthroplasty performed on 12/5. WBAT and post hip precautions.    PT Comments    Pt received supine in bed agreeable to PT. Reports eating more and sleeping over night but does continue to have nausea and sensations of feeling hot with mobility. Remains slow moving but ultimately able to transfer with bed mobility and standing with supervision and increased time to perform with RW use. Does require min VC's throughout for hand placement to assist in ease with mobilization. 15 min spent seated EOB for medication management with RN and MD at bedside on discussion with family. Orthostatics taken in sitting and standing. See below for readings. Able to progress mobility to 73' with RW with good understanding of precautions and beginning to initiate step through pattern with RLE but is minimal. Does require x2-3 standing rest breaks due to nausea and hot flashes. Chair follow provided by family for safety. After 69' pt in recliner and transported to stairs. Pt and family educated on side step and forward step techniques with PT demo. Pt successfully trialed 2 steps with side ways step to pattern and BUE support on railing throughout following "up with the good and down with the bad" with only min guard and min VC's for UE sequencing. Family educated on safe guarding techniques during asc/desc stairs with pt. Verbalizing understanding. Family reporting will need railing installed for successful stair navigation and will need time to transport hospital bed into home. Primary MD and TOC team notified  and informed along with BP readings. Pt does endorse feelings of grogginess and feeling sleepy post session in recliner. PT will continue to progress functional mobility with LRAD and reinforce stair navigation in afternoon session.   Orthostatic VS for the past 24 hrs:  BP- Sitting BP- Standing at 0 minutes  10/05/21 1111 106/59 114/42    BP sitting post session: 122/34 mm Hg    Recommendations for follow up therapy are one component of a multi-disciplinary discharge planning process, led by the attending physician.  Recommendations may be updated based on patient status, additional functional criteria and insurance authorization.  Follow Up Recommendations  Home health PT     Assistance Recommended at Discharge Frequent or constant Supervision/Assistance  Equipment Recommendations  Rolling walker (2 wheels);BSC/3in1    Recommendations for Other Services       Precautions / Restrictions Precautions Precautions: Posterior Hip Precaution Booklet Issued: Yes (comment) Restrictions Weight Bearing Restrictions: Yes LLE Weight Bearing: Weight bearing as tolerated     Mobility  Bed Mobility Overal bed mobility: Needs Assistance Bed Mobility: Supine to Sit     Supine to sit: HOB elevated;Supervision     General bed mobility comments: Requires increased time but moves quicker than yesterday. x1 VC's for UE/Le positioning/sequencing Patient Response: Cooperative  Transfers Overall transfer level: Needs assistance Equipment used: Rolling walker (2 wheels) Transfers: Sit to/from Stand Sit to Stand: Min guard                Ambulation/Gait Ambulation/Gait assistance: Min guard Gait Distance (Feet): 32 Feet Assistive device: Rolling walker (2  wheels) Gait Pattern/deviations: Step-to pattern;Decreased stance time - left;Decreased step length - right;Shuffle       General Gait Details: Step to pattern but beginning to progress RLE slightly past L foot. Still requiring  2-3 standing rest breaks throughout.   Stairs Stairs: Yes Stairs assistance: Min guard Stair Management: One rail Left;Step to pattern;Sideways Number of Stairs: 2 General stair comments: Slow but able to perform without physical assist. Required mod VC's for sequencing UE's with descent.   Wheelchair Mobility    Modified Rankin (Stroke Patients Only)       Balance Overall balance assessment: Needs assistance Sitting-balance support: No upper extremity supported;Feet supported Sitting balance-Leahy Scale: Fair     Standing balance support: During functional activity Standing balance-Leahy Scale: Fair Standing balance comment: Relies on UE's for support on RW                            Cognition Arousal/Alertness: Awake/alert Behavior During Therapy: [facility]: Lambert Rhonda W for tasks assessed/performed Overall Cognitive Status: Within Functional Limits for tasks assessed                                          Exercises Total Joint Exercises Ankle Circles/Pumps: AROM;Both;10 reps;Supine Quad Sets: Left;AROM;Strengthening;10 reps;Supine Gluteal Sets: AROM;Supine;10 reps;Strengthening Heel Slides: AROM;Supine;10 reps;Left;Strengthening Hip ABduction/ADduction: AROM;Strengthening;Left;10 reps;Supine Long Arc Quad: AROM;Seated;Both;10 reps    General Comments General comments (skin integrity, edema, etc.): SPo2 89-92%      Pertinent Vitals/Pain Pain Assessment: 0-10 Pain Score: 3  Pain Location: L hip Pain Intervention(s): Limited activity within patient's tolerance;Monitored during session;Repositioned    Home Living                          Prior Function            PT Goals (current goals can now be found in the care plan section) Acute Rehab PT Goals Patient Stated Goal: to return home with family PT Goal Formulation: With patient/family Time For Goal Achievement: 10/18/21 Potential to Achieve Goals: Good Progress towards PT goals:  Progressing toward goals    Frequency    BID      PT Plan Current plan remains appropriate    Co-evaluation              AM-PAC PT "6 Clicks" Mobility   Outcome Measure  Help needed turning from your back to your side while in a flat bed without using bedrails?: A Lot Help needed moving from lying on your back to sitting on the side of a flat bed without using bedrails?: A Lot Help needed moving to and from a bed to a chair (including a wheelchair)?: A Little Help needed standing up from a chair using your arms (e.g., wheelchair or bedside chair)?: A Little Help needed to walk in hospital room?: A Little Help needed climbing 3-5 steps with a railing? : A Little 6 Click Score: 16    End of Session Equipment Utilized During Treatment: Gait belt Activity Tolerance: Patient tolerated treatment well;Treatment limited secondary to medical complications (Comment) (Remains nauseous and having hot flashes) Patient left: in chair;with call bell/phone within reach;with chair alarm set;with family/visitor present Nurse Communication: Mobility status PT Visit Diagnosis: Other abnormalities of gait and mobility (R26.89);Muscle weakness (generalized) (M62.81);History of falling (Z91.81);Difficulty in walking, not elsewhere classified (R26.2)  Time: 0086-7619 PT Time Calculation (min) (ACUTE ONLY): 68 min  Charges:  $Gait Training: 38-52 mins $Therapeutic Exercise: 8-22 mins                     Delphia Grates. Fairly IV, PT, DPT Physical Therapist- [place]  [facility]  10/05/2021, 11:44 AM

## 2021-10-06 MED ORDER — ENOXAPARIN SODIUM 30 MG/0.3ML IJ SOSY: 30.0000 mg | PREFILLED_SYRINGE | INTRAMUSCULAR | 0 refills | Status: DC

## 2021-10-06 MED ORDER — DOCUSATE SODIUM 100 MG PO CAPS: 100.0000 mg | ORAL_CAPSULE | Freq: Two times a day (BID) | ORAL | 0 refills | Status: DC

## 2021-10-06 MED ORDER — HYDROCODONE-ACETAMINOPHEN 5-325 MG PO TABS: 1.0000 | ORAL_TABLET | ORAL | 0 refills | Status: DC | PRN

## 2021-10-06 MED ORDER — VITAMIN D (ERGOCALCIFEROL) 1.25 MG (50000 UNIT) PO CAPS
50000.0000 [IU] | ORAL_CAPSULE | ORAL | 0 refills | Status: DC
Start: 2021-10-06 — End: 2022-07-07

## 2021-10-06 MED ORDER — CYANOCOBALAMIN 1000 MCG PO TABS: 1000.0000 ug | ORAL_TABLET | Freq: Every day | ORAL | 1 refills | Status: DC

## 2021-10-06 MED ORDER — TRAMADOL HCL 50 MG PO TABS: 50.0000 mg | ORAL_TABLET | Freq: Four times a day (QID) | ORAL | 0 refills | Status: DC

## 2021-10-06 MED ORDER — VITAMIN D (ERGOCALCIFEROL) 1.25 MG (50000 UNIT) PO CAPS
50000.0000 [IU] | ORAL_CAPSULE | ORAL | Status: DC
  Administered 2021-10-06: 50000 [IU] via ORAL
  Filled 2021-10-06: qty 1

## 2021-10-06 MED ORDER — FE FUMARATE-B12-VIT C-FA-IFC PO CAPS: 1.0000 | ORAL_CAPSULE | Freq: Two times a day (BID) | ORAL | 1 refills | Status: DC

## 2021-10-06 MED ORDER — SENNA 8.6 MG PO TABS
1.0000 | ORAL_TABLET | Freq: Two times a day (BID) | ORAL | 0 refills | Status: DC
Start: 2021-10-06 — End: 2021-10-27

## 2021-10-06 MED ORDER — METHOCARBAMOL 500 MG PO TABS
500.0000 mg | ORAL_TABLET | Freq: Four times a day (QID) | ORAL | 0 refills | Status: DC | PRN
Start: 2021-10-06 — End: 2022-07-07

## 2021-10-06 MED ORDER — POLYETHYLENE GLYCOL 3350 17 G PO PACK: 17.0000 g | PACK | Freq: Every day | ORAL | 0 refills | Status: DC | PRN

## 2021-10-06 MED ORDER — ONDANSETRON HCL 4 MG PO TABS
4.0000 mg | ORAL_TABLET | Freq: Four times a day (QID) | ORAL | 0 refills | Status: DC | PRN
Start: 2021-10-06 — End: 2021-10-27

## 2021-10-06 MED ORDER — OYSTER SHELL CALCIUM/D3 500-5 MG-MCG PO TABS: 1.0000 | ORAL_TABLET | Freq: Two times a day (BID) | ORAL | 1 refills | Status: DC

## 2021-10-06 NOTE — Discharge Summary (Signed)
Physician Discharge Summary  KYMBERLIE Lee QGB:[id number] DOB: 02-23-1941 DOA: 10/02/2021  PCP: Juline Patch, MD  Admit date: 10/02/2021 Discharge date: 10/06/2021  Admitted From: Home Disposition: Home  Recommendations for Outpatient Follow-up:  Follow up with PCP in 1-2 weeks Follow-up with orthopedic surgery Please obtain BMP/CBC in one week Please follow up on the following pending results: None  Home Health: Yes Equipment/Devices: Rolling walker Discharge Condition: Stable CODE STATUS: Full Diet recommendation:  Regular   Brief/Interim Summary: Connie Lee is a 80 y.o. Caucasian female with no significant medical history, who presented to the ER with acute onset of left hip pain after an accidental mechanical fall after tripping over on her animals.  Found to have a displaced left femoral neck fracture. Orthopedic was consulted and she was taken to the OR on 10/03/2021 for hemiarthroplasty-tolerated the procedure well. She developed mild postural dizziness and nausea postoperatively.  Symptoms improved and she was able to participate with physical therapy before discharge.  Pain was well controlled with current regimen.  She was discharged with Norco and tramadol and advised to start with simple Tylenol and use narcotics only if Tylenol does not control her pain. She was also given 2 weeks of Lovenox for DVT prophylaxis.  She met criteria for osteopenia due to hip fracture with ground-level fall.  No prior DEXA scan per patient.  She was started on calcium and vitamin D supplement.  Vitamin D levels were also low at 20.  She needs to follow-up with PCP and will get benefit from bisphosphonates but details can be started 6 weeks after the surgery.  She also developed acute blood loss anemia secondary to traumatic hip fracture and hemiarthroplasty.  Anemia panel with mild iron deficiency and B12 at 263 which is within lower normal limit.  She was started on supplement and  her PCP can recheck her levels as an outpatient for further titration of dose.  Patient will continue with current medications and follow-up with her providers.  Discharge Diagnoses:  Principal Problem:   Closed left hip fracture [facility])   Discharge Instructions  Discharge Instructions     Diet - low sodium heart healthy   Complete by: As directed    Discharge instructions   Complete by: As directed    It was pleasure taking care of you. You are being given multiple medications for your pain, try using Tylenol first if that does not work then you can take tramadol and use Norco/Vicodin if the above-mentioned modalities does not work. Please be mindful that these pain medications can make you drowsy, be vigilant to avoid falls. These pain medications can cause constipation, you can use stool softeners and MiraLAX as needed. Keep your self well-hydrated. You are also being given few supplements which include iron, B12, multivitamin and vitamin D, please take it as directed and follow-up with your primary care provider to recheck your levels in 6 to 8 weeks Follow-up with your orthopedic surgeon for further recommendations.   Increase activity slowly   Complete by: As directed    Leave dressing on - Keep it clean, dry, and intact until clinic visit   Complete by: As directed       Allergies as of 10/06/2021   No Known Allergies      Medication List     TAKE these medications    calcium-vitamin D 500-5 MG-MCG tablet Commonly known as: OSCAL WITH D Take 1 tablet by mouth 2 (two) times daily.   cyanocobalamin  1000 MCG tablet Take 1 tablet (1,000 mcg total) by mouth daily.   docusate sodium 100 MG capsule Commonly known as: COLACE Take 1 capsule (100 mg total) by mouth 2 (two) times daily.   enoxaparin 30 MG/0.3ML injection Commonly known as: LOVENOX Inject 0.3 mLs (30 mg total) into the skin daily for 14 days.   ferrous [id number] C-folic acid  capsule Commonly known as: TRINSICON / FOLTRIN Take 1 capsule by mouth 2 (two) times daily after a meal.   HYDROcodone-acetaminophen 5-325 MG tablet Commonly known as: NORCO/VICODIN Take 1-2 tablets by mouth every 4 (four) hours as needed for moderate pain (pain score 4-6).   methocarbamol 500 MG tablet Commonly known as: ROBAXIN Take 1 tablet (500 mg total) by mouth every 6 (six) hours as needed for muscle spasms.   ondansetron 4 MG tablet Commonly known as: ZOFRAN Take 1 tablet (4 mg total) by mouth every 6 (six) hours as needed for nausea.   polyethylene glycol 17 g packet Commonly known as: MIRALAX / GLYCOLAX Take 17 g by mouth daily as needed for mild constipation.   senna 8.6 MG Tabs tablet Commonly known as: SENOKOT Take 1 tablet (8.6 mg total) by mouth 2 (two) times daily.   traMADol 50 MG tablet Commonly known as: ULTRAM Take 1 tablet (50 mg total) by mouth every 6 (six) hours.   Vitamin D (Ergocalciferol) 1.25 MG (50000 UNIT) Caps capsule Commonly known as: DRISDOL Take 1 capsule (50,000 Units total) by mouth every 7 (seven) days.               Discharge Care Instructions  (From admission, onward)           Start     Ordered   10/06/21 0000  Leave dressing on - Keep it clean, dry, and intact until clinic visit        10/06/21 1006            Follow-up Information     Juline Patch, MD. Schedule an appointment as soon as possible for a visit in 1 week(s).   Specialty: Family Medicine Contact information: [address] [place] [address] [phone]         Thornton Park, MD Follow up in 1 week(s).   Specialty: Orthopedic Surgery Contact information: [place] [address] [phone]                No Known Allergies  Consultations: Orthopedic surgery  Procedures/Studies: DG Chest 1 View  Result Date: 10/02/2021 CLINICAL DATA:  Recent fall with left hip pain and chest pain,  initial encounter EXAM: CHEST  1 VIEW COMPARISON:  11/15/2020 FINDINGS: Cardiac shadow is at the upper limits of normal in size. Mild aortic calcifications are noted. Lungs are hyperinflated bilaterally. No focal infiltrate is seen. Some pleural based apical density is noted on the right which may simply represent scarring although further evaluation is recommended. No acute bony abnormality is noted. Healed rib fractures on the left are noted. IMPRESSION: Mild pleural based apical density on the right. This may simply represent underlying scarring although the possibility of a more focal lesion would deserve consideration. Noncontrast CT may be helpful for further evaluation. Electronically Signed   By: Inez Catalina M.D.   On: 10/02/2021 19:46   DG HIP UNILAT WITH PELVIS 1V LEFT  Result Date: 10/03/2021 CLINICAL DATA:  Postoperative hip surgery EXAM: DG HIP (WITH OR WITHOUT PELVIS) 1V*L* COMPARISON:  10/02/2021 FINDINGS: Interval  postsurgical changes from left hip hemiarthroplasty. Arthroplasty components are in their expected alignment. No periprosthetic fracture or evidence of other complication. Expected postoperative changes within the overlying soft tissues. IMPRESSION: Satisfactory postoperative appearance status post left hip hemiarthroplasty. Electronically Signed   By: Davina Poke D.O.   On: 10/03/2021 16:43   DG Hip Unilat With Pelvis 2-3 Views Left  Result Date: 10/02/2021 CLINICAL DATA:  Status post fall.  Decreased range of motion. EXAM: DG HIP (WITH OR WITHOUT PELVIS) 2-3V LEFT COMPARISON:  None. FINDINGS: Displaced left subcapital femoral neck fracture with 15 mm of superior displacement. No other fracture or dislocation. Generalized osteopenia. Mild degenerative changes of the pubic symphysis. IMPRESSION: Acute displaced left subcapital femoral neck fracture with 15 mm of superior displacement. Electronically Signed   By: Kathreen Devoid M.D.   On: 10/02/2021 19:45     Subjective: Patient was seen and examined today.  She was working with PT.  Feeling much improved and pain well controlled under current regimen.  No nausea or dizziness with change in position today.  Daughter at bedside.  Discharge Exam: Vitals:   10/06/21 0354 10/06/21 0728  BP: 138/65 (!) 118/49  Pulse: 78 66  Resp: 20 18  Temp: 98.6 F (37 C) 98 F (36.7 C)  SpO2: 94% 96%   Vitals:   10/05/21 2023 10/05/21 2327 10/06/21 0354 10/06/21 0728  BP: (!) 130/47 (!) 108/55 138/65 (!) 118/49  Pulse: 74 72 78 66  Resp: _0 Temp: 98 F (36.7 C) 98.5 F (36.9 C) 98.6 F (37 C) 98 F (36.7 C)  TempSrc:    Oral  SpO2: 93% 95% 94% 96%  Weight:      Height:        General: Pt is alert, awake, not in acute distress Cardiovascular: RRR, S1/S2 +, no rubs, no gallops Respiratory: CTA bilaterally, no wheezing, no rhonchi Abdominal: Soft, NT, ND, bowel sounds + Extremities: no edema, no cyanosis   The results of significant diagnostics from this hospitalization (including imaging, microbiology, ancillary and laboratory) are listed below for reference.    Microbiology: Recent Results (from the past 240 hour(s))  Resp Panel by RT-PCR (Flu A&B, Covid) Nasopharyngeal Swab     Status: None   Collection Time: 10/02/21  7:22 PM   Specimen: Nasopharyngeal Swab; Nasopharyngeal(NP) swabs in vial transport medium  Result Value Ref Range Status   SARS Coronavirus 2 by RT PCR NEGATIVE NEGATIVE Final    Comment: (NOTE) SARS-CoV-2 target nucleic acids are NOT DETECTED.  The SARS-CoV-2 RNA is generally detectable in upper respiratory specimens during the acute phase of infection. The lowest concentration of SARS-CoV-2 viral copies this assay can detect is 138 copies/mL. A negative result does not preclude SARS-Cov-2 infection and should not be used as the sole basis for treatment or other patient management decisions. A negative result may occur with  improper specimen  collection/handling, submission of specimen other than nasopharyngeal swab, presence of viral mutation(s) within the areas targeted by this assay, and inadequate number of viral copies(<138 copies/mL). A negative result must be combined with clinical observations, patient history, and epidemiological information. The expected result is Negative.  Fact Sheet for Patients:  EntrepreneurPulse.com.au  Fact Sheet for Healthcare Providers:  IncredibleEmployment.be  This test is no t yet approved or cleared by the [place] FDA and  has been authorized for detection and/or diagnosis of SARS-CoV-2 by FDA under an Emergency Use Authorization (EUA). This EUA will remain  in effect (  meaning this test can be used) for the duration of the COVID-19 declaration under Section 564(b)(1) of the Act, 21 U.S.C.section 360bbb-3(b)(1), unless the authorization is terminated  or revoked sooner.       Influenza A by PCR NEGATIVE NEGATIVE Final   Influenza B by PCR NEGATIVE NEGATIVE Final    Comment: (NOTE) The Xpert Xpress SARS-CoV-2/FLU/RSV plus assay is intended as an aid in the diagnosis of influenza from Nasopharyngeal swab specimens and should not be used as a sole basis for treatment. Nasal washings and aspirates are unacceptable for Xpert Xpress SARS-CoV-2/FLU/RSV testing.  Fact Sheet for Patients: EntrepreneurPulse.com.au  Fact Sheet for Healthcare Providers: IncredibleEmployment.be  This test is not yet approved or cleared by the [place] FDA and has been authorized for detection and/or diagnosis of SARS-CoV-2 by FDA under an Emergency Use Authorization (EUA). This EUA will remain in effect (meaning this test can be used) for the duration of the COVID-19 declaration under Section 564(b)(1) of the Act, 21 U.S.C. section 360bbb-3(b)(1), unless the authorization is terminated or revoked.  Performed at [facility], [address] [place], [place] [address]      Labs: BNP (last 3 results) No results for input(s): BNP in the last 8760 hours. Basic Metabolic Panel: Recent Labs  Lab 10/02/21 1922 10/03/21 0611 10/04/21 0324  NA 138 138 134*  K 3.5 4.1 3.8  CL 104 109 105  CO2 _0 GLUCOSE 125* 111* 139*  BUN _1 CREATININE 0.83 0.73 0.73  CALCIUM 9.3 8.3* 8.0*  MG 2.1  --   --    Liver Function Tests: Recent Labs  Lab 10/03/21 0611  AST 19  ALT 15  ALKPHOS 41  BILITOT 1.1  PROT 5.9*  ALBUMIN 3.4*   No results for input(s): LIPASE, AMYLASE in the last 168 hours. No results for input(s): AMMONIA in the last 168 hours. CBC: Recent Labs  Lab 10/02/21 1922 10/03/21 0611 10/04/21 0324 10/05/21 0230  WBC 11.2* 6.5 10.1 11.6*  NEUTROABS 9.5*  --   --   --   HGB 14.1 12.4 10.9* 10.3*  HCT 41.8 37.7 33.9* 30.5*  MCV 92.3 93.5 93.1 93.6  PLT 141* 130* 110* 110*   Cardiac Enzymes: No results for input(s): CKTOTAL, CKMB, CKMBINDEX, TROPONINI in the last 168 hours. BNP: Invalid input(s): POCBNP CBG: No results for input(s): GLUCAP in the last 168 hours. D-Dimer No results for input(s): DDIMER in the last 72 hours. Hgb A1c No results for input(s): HGBA1C in the last 72 hours. Lipid Profile No results for input(s): CHOL, HDL, LDLCALC, TRIG, CHOLHDL, LDLDIRECT in the last 72 hours. Thyroid function studies No results for input(s): TSH, T4TOTAL, T3FREE, THYROIDAB in the last 72 hours.  Invalid input(s): FREET3 Anemia work up Recent Labs    10/04/21 0324 10/04/21 0945  VITAMINB12  --  263  FOLATE 10.5  --   FERRITIN 224  --   TIBC 197*  --   IRON 19*  --   RETICCTPCT 1.2  --    Urinalysis No results found for: COLORURINE, APPEARANCEUR, LABSPEC, [place], GLUCOSEU, HGBUR, BILIRUBINUR, KETONESUR, PROTEINUR, UROBILINOGEN, NITRITE, LEUKOCYTESUR Sepsis Labs Invalid input(s): PROCALCITONIN,  WBC,  LACTICIDVEN Microbiology Recent Results (from the  past 240 hour(s))  Resp Panel by RT-PCR (Flu A&B, Covid) Nasopharyngeal Swab     Status: None   Collection Time: 10/02/21  7:22 PM   Specimen: Nasopharyngeal Swab; Nasopharyngeal(NP) swabs in vial transport medium  Result Value Ref  Range Status   SARS Coronavirus 2 by RT PCR NEGATIVE NEGATIVE Final    Comment: (NOTE) SARS-CoV-2 target nucleic acids are NOT DETECTED.  The SARS-CoV-2 RNA is generally detectable in upper respiratory specimens during the acute phase of infection. The lowest concentration of SARS-CoV-2 viral copies this assay can detect is 138 copies/mL. A negative result does not preclude SARS-Cov-2 infection and should not be used as the sole basis for treatment or other patient management decisions. A negative result may occur with  improper specimen collection/handling, submission of specimen other than nasopharyngeal swab, presence of viral mutation(s) within the areas targeted by this assay, and inadequate number of viral copies(<138 copies/mL). A negative result must be combined with clinical observations, patient history, and epidemiological information. The expected result is Negative.  Fact Sheet for Patients:  EntrepreneurPulse.com.au  Fact Sheet for Healthcare Providers:  IncredibleEmployment.be  This test is no t yet approved or cleared by the [place] FDA and  has been authorized for detection and/or diagnosis of SARS-CoV-2 by FDA under an Emergency Use Authorization (EUA). This EUA will remain  in effect (meaning this test can be used) for the duration of the COVID-19 declaration under Section 564(b)(1) of the Act, 21 U.S.C.section 360bbb-3(b)(1), unless the authorization is terminated  or revoked sooner.       Influenza A by PCR NEGATIVE NEGATIVE Final   Influenza B by PCR NEGATIVE NEGATIVE Final    Comment: (NOTE) The Xpert Xpress SARS-CoV-2/FLU/RSV plus assay is intended as an aid in the diagnosis of  influenza from Nasopharyngeal swab specimens and should not be used as a sole basis for treatment. Nasal washings and aspirates are unacceptable for Xpert Xpress SARS-CoV-2/FLU/RSV testing.  Fact Sheet for Patients: EntrepreneurPulse.com.au  Fact Sheet for Healthcare Providers: IncredibleEmployment.be  This test is not yet approved or cleared by the [place] FDA and has been authorized for detection and/or diagnosis of SARS-CoV-2 by FDA under an Emergency Use Authorization (EUA). This EUA will remain in effect (meaning this test can be used) for the duration of the COVID-19 declaration under Section 564(b)(1) of the Act, 21 U.S.C. section 360bbb-3(b)(1), unless the authorization is terminated or revoked.  Performed at [facility], [address] [place], [place] [address]     Time coordinating discharge: Over 30 minutes  SIGNED:  Lorella Nimrod, MD  [facility] 10/06/2021, 10:09 AM  If 7PM-7AM, please contact night-coverage [email or web address]  This record has been created using Systems analyst. Errors have been sought and corrected,but may not always be located. Such creation errors do not reflect on the standard of care.

## 2021-10-06 NOTE — Progress Notes (Signed)
Occupational Therapy Treatment Patient Details Name: Connie Lee MRN: [id number] DOB: 11/28/1940 Today's Date: 10/06/2021   History of present illness Connie Lee is a 80 y.o. 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 L hip fx with L hip hemiarthroplasty performed on 12/5. WBAT and post hip precautions.   OT comments  Upon entering the room, pt's daughter asking therapist to assist her placing bed pan under pt. OT discussed with pt and caregiver that pt should not be using bedpan and should always be transferring/ambulating to [facility] or bathroom based on pt's goals of care and current level of mobility. Pt performed bed mobility with supervision and increased time with min cuing for technique. Pt standing from EOB with supervision and use of RW. Pt ambulating short distance with RW with supervision and increased time to [facility]. Pt able to stand and perform hygiene with min guard for standing balance. Pt then transitioned to PT session without difficulty. OT recommendation changed to [facility] based on pt's progress.    Recommendations for follow up therapy are one component of a multi-disciplinary discharge planning process, led by the attending physician.  Recommendations may be updated based on patient status, additional functional criteria and insurance authorization.    Follow Up Recommendations  Home health OT    Assistance Recommended at Discharge Frequent or constant Supervision/Assistance  Equipment Recommendations  None recommended by OT       Precautions / Restrictions Precautions Precautions: Posterior Hip Restrictions Weight Bearing Restrictions: Yes LLE Weight Bearing: Weight bearing as tolerated       Mobility Bed Mobility Overal bed mobility: Needs Assistance Bed Mobility: Supine to Sit;Sit to Supine      Supine to sit: HOB elevated;Supervision Sit to supine: Supervision   General bed mobility comments: increased time    Transfers Overall transfer level: Needs assistance Equipment used: Rolling walker (2 wheels) Transfers: Sit to/from Stand Sit to Stand: Supervision Stand pivot transfers: Supervision Step pivot transfers: Supervision       General transfer comment: use of RW and increased time to complete tasks     Balance Overall balance assessment: Needs assistance Sitting-balance support: No upper extremity supported;Feet supported Sitting balance-Leahy Scale: Good     Standing balance support: During functional activity;Reliant on assistive device for balance Standing balance-Leahy Scale: Fair Standing balance comment: Relies on UE's for support on RW                           ADL either performed or assessed with clinical judgement   ADL Overall ADL's : Needs assistance/impaired                         Toilet Transfer: Rolling walker (2 wheels);BSC/3in1;Supervision/safety   Toileting- Clothing Manipulation and Hygiene: Min guard;Sit to/from stand Toileting - Clothing Manipulation Details (indicate cue type and reason): min guard for standing balance     Functional mobility during ADLs: Supervision/safety;Rolling walker (2 wheels)       Vision Patient Visual Report: No change from baseline            Cognition Arousal/Alertness: Awake/alert Behavior During Therapy: WFL for tasks assessed/performed Overall Cognitive Status: Within Functional Limits for tasks assessed  Pertinent Vitals/ Pain       Pain Assessment: 0-10 Pain Score: 2  Pain Location: L hip Pain Descriptors / Indicators: Aching;Discomfort Pain Intervention(s): Limited activity within patient's tolerance;Monitored during session;Premedicated before session;Repositioned         Frequency   Min 2X/week        Progress Toward Goals  OT Goals(current goals can now be found in the care plan section)  Progress towards OT goals: Progressing toward goals  Acute Rehab OT Goals Patient Stated Goal: to return home OT Goal Formulation: With patient/family Time For Goal Achievement: 10/18/21 Potential to Achieve Goals: Good  Plan Discharge plan needs to be updated;Frequency remains appropriate       AM-PAC OT "6 Clicks" Daily Activity     Outcome Measure   Help from another person eating meals?: None Help from another person taking care of personal grooming?: None Help from another person toileting, which includes using toliet, bedpan, or urinal?: A Little Help from another person bathing (including washing, rinsing, drying)?: A Little Help from another person to put on and taking off regular upper body clothing?: None Help from another person to put on and taking off regular lower body clothing?: A Little 6 Click Score: 21    End of Session Equipment Utilized During Treatment: Rolling walker (2 wheels)  OT Visit Diagnosis: Unsteadiness on feet (R26.81);Repeated falls (R29.6);Muscle weakness (generalized) (M62.81)   Activity Tolerance Patient tolerated treatment well   Patient Left with family/visitor present;Other (comment) (Pt arrived for pt to transition to next therapy session)   Nurse Communication Mobility status        Time: 4270-6237 OT Time Calculation (min): 16 min  Charges: OT General Charges $OT Visit: 1 Visit OT Treatments $Self Care/Home Management : 8-22 mins  Jackquline Denmark, MS, OTR/L , CBIS ascom [phone]  10/06/21, 12:49 PM

## 2021-10-06 NOTE — Care Management Important Message (Signed)
Important Message  Patient Details  Name: Connie Lee MRN: [id number] Date of Birth: 1941/10/17   Medicare Important Message Given:  Yes     Olegario Messier A Claretha Townshend 10/06/2021, 11:17 AM

## 2021-10-06 NOTE — Progress Notes (Signed)
Connie Lee to be D/C'd Home per MD order.  Discussed prescriptions and follow up appointments with the patient. Prescriptions were sent to patient's pharmacy medication list explained in detail. Pt verbalized understanding. Educated daughter on how to administer Lovenox injections. Daughter verbalized understanding. Daughter at bedside to transport pt home.  Allergies as of 10/06/2021   No Known Allergies      Medication List     TAKE these medications    calcium-vitamin D 500-5 MG-MCG tablet Commonly known as: OSCAL WITH D Take 1 tablet by mouth 2 (two) times daily.   cyanocobalamin 1000 MCG tablet Take 1 tablet (1,000 mcg total) by mouth daily.   docusate sodium 100 MG capsule Commonly known as: COLACE Take 1 capsule (100 mg total) by mouth 2 (two) times daily.   enoxaparin 30 MG/0.3ML injection Commonly known as: LOVENOX Inject 0.3 mLs (30 mg total) into the skin daily for 14 days.   ferrous fumarate-b12-vitamic C-folic acid capsule Commonly known as: TRINSICON / FOLTRIN Take 1 capsule by mouth 2 (two) times daily after a meal.   HYDROcodone-acetaminophen 5-325 MG tablet Commonly known as: NORCO/VICODIN Take 1-2 tablets by mouth every 4 (four) hours as needed for moderate pain (pain score 4-6).   methocarbamol 500 MG tablet Commonly known as: ROBAXIN Take 1 tablet (500 mg total) by mouth every 6 (six) hours as needed for muscle spasms.   ondansetron 4 MG tablet Commonly known as: ZOFRAN Take 1 tablet (4 mg total) by mouth every 6 (six) hours as needed for nausea.   polyethylene glycol 17 g packet Commonly known as: MIRALAX / GLYCOLAX Take 17 g by mouth daily as needed for mild constipation.   senna 8.6 MG Tabs tablet Commonly known as: SENOKOT Take 1 tablet (8.6 mg total) by mouth 2 (two) times daily.   traMADol 50 MG tablet Commonly known as: ULTRAM Take 1 tablet (50 mg total) by mouth every 6 (six) hours.   Vitamin D (Ergocalciferol) 1.25 MG (50000  UNIT) Caps capsule Commonly known as: DRISDOL Take 1 capsule (50,000 Units total) by mouth every 7 (seven) days.               Discharge Care Instructions  (From admission, onward)           Start     Ordered   10/06/21 0000  Leave dressing on - Keep it clean, dry, and intact until clinic visit        10/06/21 1006            Vitals:   10/06/21 0728 10/06/21 1132  BP: (!) 118/49 (!) 112/54  Pulse: 66 68  Resp: 18 17  Temp: 98 F (36.7 C) 98.2 F (36.8 C)  SpO2: 96% 97%    Skin clean, dry and intact without evidence of skin break down, no evidence of skin tears noted. IV catheter discontinued intact. Site without signs and symptoms of complications. Dressing and pressure applied. Pt denies pain at this time. No complaints noted.  An After Visit Summary was printed and given to the patient. Patient escorted via WC, and D/C home via private auto.  Rigoberto Noel

## 2021-10-06 NOTE — Progress Notes (Signed)
Physical Therapy Treatment Patient Details Name: Connie Lee MRN: [id number] DOB: [birth year] Today's Date: 10/06/2021   History of Present Illness Connie Lee is a 80 y.o. 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 L hip fx with L hip hemiarthroplasty performed on 12/5. WBAT and post hip precautions.    PT Comments    Pt received standing at [facility] with OT finishing session. Pt reports being confident in stairs and does not need further education/training. Tolerated ambulating 69' today with RW and chair follow with further attempts to progress step through reciprocal pattern with fair carryover.  Pt fatigued from OT and declining further ambulation past 80'. Returned to room in recliner and then performing stand step pivot transfer recliner to bed with increased time and min VC's for sequencing LLE to maintain L hip flexion precaution with sitting. Supervision remains to return to supine in bed with hands on bed rails. Pt performing all needed mobility to safely return to home environment and pt/family has no further questions.     Recommendations for follow up therapy are one component of a multi-disciplinary discharge planning process, led by the attending physician.  Recommendations may be updated based on patient status, additional functional criteria and insurance authorization.  Follow Up Recommendations  Home health PT     Assistance Recommended at Discharge Frequent or constant Supervision/Assistance  Equipment Recommendations  Rolling walker (2 wheels);BSC/3in1    Recommendations for Other Services       Precautions / Restrictions Precautions Precautions: Posterior Hip Precaution Booklet Issued: Yes (comment) Restrictions Weight Bearing Restrictions: Yes LLE Weight Bearing: Weight bearing as tolerated      Mobility  Bed Mobility Overal bed mobility: Needs Assistance Bed Mobility: Sit to Supine     Supine to sit: HOB elevated;Supervision Sit to supine: Supervision   General bed mobility comments: increased time to return to supine. Began session standing with OT Patient Response: Cooperative  Transfers Overall transfer level: Needs assistance Equipment used: Rolling walker (2 wheels) Transfers: Sit to/from Stand Sit to Stand: Supervision Stand pivot transfers: Supervision   Step pivot transfers: Supervision     General transfer comment: use of RW and increased time to complete tasks    Ambulation/Gait Ambulation/Gait assistance: Supervision Gait Distance (Feet): 80 Feet Assistive device: Rolling walker (2 wheels) Gait Pattern/deviations: Step-to pattern;Decreased stance time - left;Decreased step length - right;Step-through pattern       General Gait Details: COntinuing to be between step to to a step through pattern. Requiring VC's to progress to step through. Improved gait speed with attempts to progress step through pattern.   Stairs             Wheelchair Mobility    Modified Rankin (Stroke Patients Only)       Balance Overall balance assessment: Needs assistance Sitting-balance support: No upper extremity supported;Feet supported Sitting balance-Leahy Scale: Good     Standing balance support: During functional activity;Reliant on assistive device for balance Standing balance-Leahy Scale: Good Standing balance comment: Relies on UE's for support on RW                            Cognition Arousal/Alertness: Awake/alert Behavior During Therapy: [facility] for tasks assessed/performed Overall Cognitive Status: Within Functional Limits for tasks assessed  Exercises Other Exercises Other Exercises: Gait mechanics    General Comments        Pertinent Vitals/Pain Pain  Assessment: 0-10 Pain Score: 2  Pain Location: L hip Pain Descriptors / Indicators: Aching;Discomfort Pain Intervention(s): Limited activity within patient's tolerance;Monitored during session;Repositioned    Home Living                          Prior Function            PT Goals (current goals can now be found in the care plan section) Acute Rehab PT Goals Patient Stated Goal: to return home with family PT Goal Formulation: With patient/family Time For Goal Achievement: 10/18/21 Potential to Achieve Goals: Good Progress towards PT goals: Progressing toward goals    Frequency    BID      PT Plan Current plan remains appropriate    Co-evaluation              AM-PAC PT "6 Clicks" Mobility   Outcome Measure  Help needed turning from your back to your side while in a flat bed without using bedrails?: A Lot Help needed moving from lying on your back to sitting on the side of a flat bed without using bedrails?: A Lot Help needed moving to and from a bed to a chair (including a wheelchair)?: A Little Help needed standing up from a chair using your arms (e.g., wheelchair or bedside chair)?: A Little Help needed to walk in hospital room?: A Little Help needed climbing 3-5 steps with a railing? : A Little 6 Click Score: 16    End of Session Equipment Utilized During Treatment: Gait belt Activity Tolerance: Patient tolerated treatment well Patient left: in bed;with call bell/phone within reach;with family/visitor present;with bed alarm set         Time: 5597-4163 PT Time Calculation (min) (ACUTE ONLY): 40 min  Charges:  $Gait Training: 38-52 mins                     Delphia Grates. Fairly IV, PT, DPT Physical Therapist- [facility]  10/06/2021, 12:04 PM

## 2021-10-07 ENCOUNTER — Telehealth: Payer: Self-pay

## 2021-10-07 NOTE — Telephone Encounter (Signed)
Transition Care Management Follow-up Telephone Call Date of discharge and from where: 10/06/21 How have you been since you were released from the hospital? Pt states she is doing okay Any questions or concerns? No  Items Reviewed: Did the pt receive and understand the discharge instructions provided? Yes  Medications obtained and verified? Yes  Other? No  Any new allergies since your discharge? No  Dietary orders reviewed? Yes Do you have support at home? Yes   Home Care and Equipment/Supplies: Were home health services ordered? yes If so, what is the name of the agency? [facility]  Has the agency set up a time to come to the patient's home? yes Were any new equipment or medical supplies ordered?  No - pt had all necessary DME at home prior to admission  Functional Questionnaire: (I = Independent and D = Dependent) ADLs: I  Bathing/Dressing- D  Meal Prep- D  Eating- I  Maintaining continence- I  Transferring/Ambulation- I with assistance  Managing Meds- I  Follow up appointments reviewed:  PCP Hospital f/u appt confirmed? Yes  Scheduled to see Dr. Yetta Barre on 10/13/21 @ 10:40. Specialist Hospital f/u appt confirmed? Yes  Scheduled to see ortho on 10/10/21. Are transportation arrangements needed? No  If their condition worsens, is the pt aware to call PCP or go to the Emergency Dept.? Yes Was the patient provided with contact information for the PCP's office or ED? Yes Was to pt encouraged to call back with questions or concerns? Yes

## 2021-10-10 DIAGNOSIS — Z96642 Presence of left artificial hip joint: Secondary | ICD-10-CM | POA: Diagnosis not present

## 2021-10-13 ENCOUNTER — Ambulatory Visit (INDEPENDENT_AMBULATORY_CARE_PROVIDER_SITE_OTHER): Payer: Medicare HMO | Admitting: Family Medicine

## 2021-10-13 ENCOUNTER — Other Ambulatory Visit: Payer: Self-pay

## 2021-10-13 ENCOUNTER — Encounter: Payer: Self-pay | Admitting: Family Medicine

## 2021-10-13 VITALS — BP 130/70 | HR 80 | Ht 65.0 in | Wt 135.0 lb

## 2021-10-13 DIAGNOSIS — D508 Other iron deficiency anemias: Secondary | ICD-10-CM

## 2021-10-13 DIAGNOSIS — Z09 Encounter for follow-up examination after completed treatment for conditions other than malignant neoplasm: Secondary | ICD-10-CM

## 2021-10-13 DIAGNOSIS — R9389 Abnormal findings on diagnostic imaging of other specified body structures: Secondary | ICD-10-CM | POA: Diagnosis not present

## 2021-10-13 DIAGNOSIS — Z780 Asymptomatic menopausal state: Secondary | ICD-10-CM

## 2021-10-13 NOTE — Progress Notes (Signed)
Date:  10/13/2021   Name:  Connie Lee   DOB:  [birth year]   MRN:  [id number]   Chief Complaint: Hospitalization Follow-up (Fall- fx hip/ partial hip replacement)  Pt was recently admitted to [facility] for diagnosis of displaced left femoral neck fracture requiring hemiarthroscopy plasty  on 10/02/2021 and was discharged on 10/06/2021. Transition of care call placed on 10/07/2021.    Patient's labs were reviewed which it was noted that there is a decrease of hemoglobin to 12.3 but that is improved from the previous.  Patient is on iron and we will recheck.  Patient was also noted to have a decrease in magnesium and this will be rechecked as well  Lab Results  Component Value Date   NA 134 (L) 10/04/2021   K 3.8 10/04/2021   CO2 23 10/04/2021   GLUCOSE 139 (H) 10/04/2021   BUN 11 10/04/2021   CREATININE 0.73 10/04/2021   CALCIUM 8.0 (L) 10/04/2021   GFRNONAA >60 10/04/2021   No results found for: CHOL, HDL, LDLCALC, LDLDIRECT, TRIG, CHOLHDL No results found for: TSH No results found for: HGBA1C Lab Results  Component Value Date   WBC 11.6 (H) 10/05/2021   HGB 10.3 (L) 10/05/2021   HCT 30.5 (L) 10/05/2021   MCV 93.6 10/05/2021   PLT 110 (L) 10/05/2021   Lab Results  Component Value Date   ALT 15 10/03/2021   AST 19 10/03/2021   ALKPHOS 41 10/03/2021   BILITOT 1.1 10/03/2021   Lab Results  Component Value Date   VD25OH 20.67 (L) 10/05/2021     Review of Systems  Constitutional:  Negative for chills and fever.  HENT:  Negative for drooling, ear discharge, ear pain and sore throat.   Respiratory:  Negative for cough, shortness of breath and wheezing.   Cardiovascular:  Negative for chest pain, palpitations and leg swelling.  Gastrointestinal:  Negative for abdominal pain, blood in stool, constipation, diarrhea and nausea.  Endocrine: Negative for polydipsia.  Genitourinary:  Negative for dysuria, frequency, hematuria and urgency.   Musculoskeletal:  Negative for back pain, myalgias and neck pain.  Skin:  Negative for rash.  Allergic/Immunologic: Negative for environmental allergies.  Neurological:  Negative for dizziness and headaches.  Hematological:  Does not bruise/bleed easily.  Psychiatric/Behavioral:  Negative for suicidal ideas. The patient is not nervous/anxious.    Patient Active Problem List   Diagnosis Date Noted   Closed left hip fracture (HCC) 10/02/2021    No Known Allergies  Past Surgical History:  Procedure Laterality Date   ABDOMINAL HYSTERECTOMY     APPENDECTOMY     HIP ARTHROPLASTY Left 10/03/2021   Procedure: ARTHROPLASTY BIPOLAR HIP (HEMIARTHROPLASTY);  Surgeon: Juanell Fairly, MD;  Location: [facility] ORS;  Service: Orthopedics;  Laterality: Left;    Social History   Tobacco Use   Smoking status: Never   Smokeless tobacco: Never  Vaping Use   Vaping Use: Never used  Substance Use Topics   Alcohol use: Not Currently   Drug use: Not Currently     Medication list has been reviewed and updated. Patient's labs and medications were reviewed and there is no additional labs as noted above upon review. Current Meds  Medication Sig   calcium-vitamin D (OSCAL WITH D) 500-5 MG-MCG tablet Take 1 tablet by mouth 2 (two) times daily.   docusate sodium (COLACE) 100 MG capsule Take 1 capsule (100 mg total) by mouth 2 (two) times daily.   enoxaparin (LOVENOX) 30 MG/0.3ML injection  Inject 0.3 mLs (30 mg total) into the skin daily for 14 days.   ferrous [id number] C-folic acid (TRINSICON / FOLTRIN) capsule Take 1 capsule by mouth 2 (two) times daily after a meal.   HYDROcodone-acetaminophen (NORCO/VICODIN) 5-325 MG tablet Take 1-2 tablets by mouth every 4 (four) hours as needed for moderate pain (pain score 4-6).   methocarbamol (ROBAXIN) 500 MG tablet Take 1 tablet (500 mg total) by mouth every 6 (six) hours as needed for muscle spasms.   polyethylene glycol (MIRALAX / GLYCOLAX) 17 g  packet Take 17 g by mouth daily as needed for mild constipation.   traMADol (ULTRAM) 50 MG tablet Take 1 tablet (50 mg total) by mouth every 6 (six) hours.   vitamin B-12 1000 MCG tablet Take 1 tablet (1,000 mcg total) by mouth daily.   Vitamin D, Ergocalciferol, (DRISDOL) 1.25 MG (50000 UNIT) CAPS capsule Take 1 capsule (50,000 Units total) by mouth every 7 (seven) days.  Patient is not requiring Vicodin on a regular basis nor is having to take the methocarbamol on a regular basis for muscle spasms.  Patient will take her Lovenox to its completion and then it will be discontinued.  Patient is not having to use Zofran on a regular basis as well.  PHQ 2/9 Scores 10/13/2021 06/22/2021 12/07/2020 11/12/2020  PHQ - 2 Score 0 0 0 0  PHQ- 9 Score 0 - 0 -    GAD 7 : Generalized Anxiety Score 10/13/2021 12/07/2020  Nervous, Anxious, on Edge 0 0  Control/stop worrying 0 0  Worry too much - different things 0 0  Trouble relaxing 0 0  Restless 0 0  Easily annoyed or irritable 0 0  Afraid - awful might happen 0 0  Total GAD 7 Score 0 0    BP Readings from Last 3 Encounters:  10/13/21 130/70  10/06/21 (!) 112/54  12/07/20 124/80    Physical Exam Vitals and nursing note reviewed.  Constitutional:      General: She is not in acute distress.    Appearance: She is not diaphoretic.  HENT:     Head: Normocephalic and atraumatic.     Right Ear: Tympanic membrane and external ear normal.     Left Ear: Tympanic membrane and external ear normal.     Nose: Nose normal.  Eyes:     General: Lids are normal. Vision grossly intact. Gaze aligned appropriately.        Right eye: No discharge.        Left eye: No discharge.     Conjunctiva/sclera: Conjunctivae normal.     Pupils: Pupils are equal, round, and reactive to light.     Comments: Conjunctiva normal  Neck:     Thyroid: No thyromegaly.     Vascular: No JVD.  Cardiovascular:     Rate and Rhythm: Normal rate and regular rhythm.     Heart sounds:  Normal heart sounds, S1 normal and S2 normal. No murmur heard. No systolic murmur is present.  No diastolic murmur is present.    No friction rub. No gallop. No S3 or S4 sounds.  Pulmonary:     Effort: Pulmonary effort is normal.     Breath sounds: Normal breath sounds. No decreased breath sounds, wheezing, rhonchi or rales.  Abdominal:     General: Bowel sounds are normal.     Palpations: Abdomen is soft. There is no hepatomegaly, splenomegaly or mass.     Tenderness: There is no abdominal tenderness. There is  no guarding.  Musculoskeletal:        General: Normal range of motion.     Cervical back: Normal range of motion and neck supple.     Right lower leg: No edema.     Left lower leg: No edema.  Lymphadenopathy:     Cervical: No cervical adenopathy.  Skin:    General: Skin is warm and dry.  Neurological:     Mental Status: She is alert.     Deep Tendon Reflexes: Reflexes are normal and symmetric.    Wt Readings from Last 3 Encounters:  10/13/21 135 lb (61.2 kg)  10/03/21 143 lb 11.8 oz (65.2 kg)  12/07/20 136 lb (61.7 kg)    BP 130/70    Pulse 80    Ht 5\' 5"  (1.651 m)    Wt 135 lb (61.2 kg)    BMI 22.47 kg/m   Assessment and Plan:  1. Hospital discharge follow-up Patient is discharged from hospital for hemiarthroplasty of the left hip due to a fall resulting in a displaced femoral neck fracture.  Patient has done well status post surgery and is ambulating with help of walker.  Below are the 3 areas of concerns that needed follow-up: - CT Chest Wo Contrast  2. Other iron deficiency anemia Patient was noted to have a hemoglobin that was decreased status post surgery due to blood loss with a discharge hemoglobin of 10.3.  Patient is taking iron for regulation of her hemoglobin at a current dosing of current dosing of Trinsicon/Foltrin capsule twice a day.  We will obtain a CBC for current status of hemoglobin correction. - CBC with Differential/Platelet - Ferritin  3.  Menopause It was noted that patient had decreased bone mass on x-ray suggesting that she may be osteoporotic.  Given her history of menopause we will set her up for bone density DEXA scan for evaluation and if there is presence of osteoporosis we will wait for the necessary healing over 6 weeks before initiating bisphosphonate. - DG Bone Density  4. Abnormal chest x-ray Noted on chest x-ray that there was an abnormality of a pleural-based apical density of the right.  Suggestion is that we may want to pursue further evaluation of chest CT without contrast to further delineate this and this has been so ordered. - CT Chest Wo Contrast

## 2021-10-14 LAB — CBC WITH DIFFERENTIAL/PLATELET
Basophils Absolute: 0.1 10*3/uL (ref 0.0–0.2)
Basos: 1 %
EOS (ABSOLUTE): 0.2 10*3/uL (ref 0.0–0.4)
Eos: 3 %
Hematocrit: 40.7 % (ref 34.0–46.6)
Hemoglobin: 13.4 g/dL (ref 11.1–15.9)
Immature Grans (Abs): 0 10*3/uL (ref 0.0–0.1)
Immature Granulocytes: 0 %
Lymphocytes Absolute: 2.7 10*3/uL (ref 0.7–3.1)
Lymphs: 29 %
MCH: 30.5 pg (ref 26.6–33.0)
MCHC: 32.9 g/dL (ref 31.5–35.7)
MCV: 93 fL (ref 79–97)
Monocytes Absolute: 0.7 10*3/uL (ref 0.1–0.9)
Monocytes: 7 %
Neutrophils Absolute: 5.8 10*3/uL (ref 1.4–7.0)
Neutrophils: 60 %
Platelets: 335 10*3/uL (ref 150–450)
RBC: 4.4 x10E6/uL (ref 3.77–5.28)
RDW: 12.5 % (ref 11.7–15.4)
WBC: 9.6 10*3/uL (ref 3.4–10.8)

## 2021-10-14 LAB — FERRITIN: Ferritin: 646 ng/mL — ABNORMAL HIGH (ref 15–150)

## 2021-10-25 ENCOUNTER — Ambulatory Visit
Admission: RE | Admit: 2021-10-25 | Discharge: 2021-10-25 | Disposition: A | Discharge status: Home / Self Care | Payer: Medicare HMO | Source: Ambulatory Visit | Attending: Family Medicine | Admitting: Family Medicine

## 2021-10-25 ENCOUNTER — Other Ambulatory Visit: Payer: Self-pay

## 2021-10-25 DIAGNOSIS — R9389 Abnormal findings on diagnostic imaging of other specified body structures: Secondary | ICD-10-CM | POA: Insufficient documentation

## 2021-10-25 DIAGNOSIS — Z09 Encounter for follow-up examination after completed treatment for conditions other than malignant neoplasm: Secondary | ICD-10-CM | POA: Insufficient documentation

## 2021-10-25 DIAGNOSIS — I700 Atherosclerosis of aorta: Secondary | ICD-10-CM | POA: Diagnosis not present

## 2021-10-27 ENCOUNTER — Telehealth: Payer: Self-pay

## 2021-10-27 ENCOUNTER — Ambulatory Visit
Admission: EM | Admit: 2021-10-27 | Discharge: 2021-10-27 | Disposition: A | Discharge status: Home / Self Care | Payer: Medicare HMO | Attending: Emergency Medicine | Admitting: Emergency Medicine

## 2021-10-27 ENCOUNTER — Other Ambulatory Visit: Payer: Self-pay

## 2021-10-27 DIAGNOSIS — L600 Ingrowing nail: Secondary | ICD-10-CM | POA: Diagnosis not present

## 2021-10-27 MED ORDER — DICLOFENAC SODIUM 1 % EX GEL: 2.0000 g | Freq: Four times a day (QID) | CUTANEOUS | 0 refills | Status: DC

## 2021-10-27 MED ORDER — TRAMADOL HCL 50 MG PO TABS: 50.0000 mg | ORAL_TABLET | Freq: Four times a day (QID) | ORAL | 0 refills | Status: AC

## 2021-10-27 MED ORDER — LIDOCAINE-PRILOCAINE 2.5-2.5 % EX CREA: 1.0000 "application " | TOPICAL_CREAM | CUTANEOUS | 0 refills | Status: DC | PRN

## 2021-10-27 NOTE — Telephone Encounter (Signed)
Pt called in asking if she can go to UC to be seen for ingrown toenail. She states that it is bothering her real bad and painful to where sheets bother touching it. Advised pt she can go to UC to be seen for this so she doesn't have to wait for appt with Dr. Yetta Barre on 11/01/21. Pt verbalized understanding.

## 2021-10-27 NOTE — Discharge Instructions (Addendum)
Please call and schedule an appointment with podiatry for further evaluation of your toe  You may continue use of Neosporin will will keep area from becoming infected  You may apply lidocaine or diclofenac gel to affected area to help reduce inflammation relief  In addition  Do not pick at your toenail or try to remove it yourself. Soak your foot in warm, soapy water. Do this for 20 minutes, 3 times a day, or as often as told by your health care provider. This helps to keep your toe clean and your skin soft. Wear shoes that fit well and are not too tight. Your health care provider may recommend that you wear open-toed shoes while you heal. Do not cut your nails in a curved shape. Keep your feet clean and dry to help prevent infection.

## 2021-10-27 NOTE — ED Triage Notes (Signed)
Pt c/o big toe injury to right foot x2weeks. Pt states that there is a sore on her big toe that is painful to the touch.

## 2021-10-27 NOTE — ED Provider Notes (Signed)
[facility]    CSN: [id number] Arrival date & time: 10/27/21  1350      History   Chief Complaint No chief complaint on file.   HPI Connie Lee is a 80 y.o. female.   Patient presents with throbbing of the right great toe for two weeks. Symptoms started abruptly without precipitating event, trauma or injury. ROM intact. Denies numbness or tingling. Has been using tramadol for pain management which was prescribed for recent hip surgery, medication is effective.Has been applying neosporin daily.      History reviewed. No pertinent past medical history.  Patient Active Problem List   Diagnosis Date Noted   Closed left hip fracture (HCC) 10/02/2021    Past Surgical History:  Procedure Laterality Date   ABDOMINAL HYSTERECTOMY     APPENDECTOMY     HIP ARTHROPLASTY Left 10/03/2021   Procedure: ARTHROPLASTY BIPOLAR HIP (HEMIARTHROPLASTY);  Surgeon: Juanell Fairly, MD;  Location: [facility] ORS;  Service: Orthopedics;  Laterality: Left;    OB History   No obstetric history on file.      Home Medications    Prior to Admission medications   Medication Sig Start Date End Date Taking? Authorizing Provider  calcium-vitamin D (OSCAL WITH D) 500-5 MG-MCG tablet Take 1 tablet by mouth 2 (two) times daily. 10/06/21  Yes Arnetha Courser, MD  ferrous fumarate-b12-vitamic C-folic acid (TRINSICON / FOLTRIN) capsule Take 1 capsule by mouth 2 (two) times daily after a meal. 10/06/21  Yes Arnetha Courser, MD  methocarbamol (ROBAXIN) 500 MG tablet Take 1 tablet (500 mg total) by mouth every 6 (six) hours as needed for muscle spasms. 10/06/21  Yes Arnetha Courser, MD  polyethylene glycol (MIRALAX / GLYCOLAX) 17 g packet Take 17 g by mouth daily as needed for mild constipation. 10/06/21  Yes Arnetha Courser, MD  senna (SENOKOT) 8.6 MG TABS tablet Take 1 tablet (8.6 mg total) by mouth 2 (two) times daily. 10/06/21  Yes Arnetha Courser, MD  vitamin B-12 1000 MCG tablet Take 1 tablet (1,000 mcg  total) by mouth daily. 10/06/21  Yes Arnetha Courser, MD  Vitamin D, Ergocalciferol, (DRISDOL) 1.25 MG (50000 UNIT) CAPS capsule Take 1 capsule (50,000 Units total) by mouth every 7 (seven) days. 10/06/21  Yes Arnetha Courser, MD  docusate sodium (COLACE) 100 MG capsule Take 1 capsule (100 mg total) by mouth 2 (two) times daily. 10/06/21   Arnetha Courser, MD  enoxaparin (LOVENOX) 30 MG/0.3ML injection Inject 0.3 mLs (30 mg total) into the skin daily for 14 days. 10/06/21 10/20/21  Arnetha Courser, MD  HYDROcodone-acetaminophen (NORCO/VICODIN) 5-325 MG tablet Take 1-2 tablets by mouth every 4 (four) hours as needed for moderate pain (pain score 4-6). 10/06/21   Arnetha Courser, MD  ondansetron (ZOFRAN) 4 MG tablet Take 1 tablet (4 mg total) by mouth every 6 (six) hours as needed for nausea. Patient not taking: Reported on 10/13/2021 10/06/21   Arnetha Courser, MD  traMADol (ULTRAM) 50 MG tablet Take 1 tablet (50 mg total) by mouth every 6 (six) hours. 10/06/21   Arnetha Courser, MD    Family History History reviewed. No pertinent family history.  Social History Social History   Tobacco Use   Smoking status: Never   Smokeless tobacco: Never  Vaping Use   Vaping Use: Never used  Substance Use Topics   Alcohol use: Not Currently   Drug use: Not Currently     Allergies   Patient has no known allergies.   Review of Systems Review of  Systems  Constitutional: Negative.   Respiratory: Negative.    Cardiovascular: Negative.   Skin:  Positive for wound. Negative for color change, pallor and rash.  Neurological: Negative.     Physical Exam Triage Vital Signs ED Triage Vitals  Enc Vitals Group     BP 10/27/21 1525 (!) 171/68     Pulse Rate 10/27/21 1525 73     Resp 10/27/21 1525 18     Temp 10/27/21 1525 97.9 F (36.6 C)     Temp Source 10/27/21 1525 Oral     SpO2 10/27/21 1525 99 %     Weight 10/27/21 1521 150 lb (68 kg)     Height 10/27/21 1521 5\' 5"  (1.651 m)     Head Circumference --       Peak Flow --      Pain Score 10/27/21 1521 9     Pain Loc --      Pain Edu? --      Excl. in GC? --    No data found.  Updated Vital Signs BP (!) 171/68 (BP Location: Left Arm)    Pulse 73    Temp 97.9 F (36.6 C) (Oral)    Resp 18    Ht 5\' 5"  (1.651 m)    Wt 150 lb (68 kg)    SpO2 99%    BMI 24.96 kg/m   Visual Acuity Right Eye Distance:   Left Eye Distance:   Bilateral Distance:    Right Eye Near:   Left Eye Near:    Bilateral Near:     Physical Exam Constitutional:      Appearance: Normal appearance.  HENT:     Head: Normocephalic.  Eyes:     Extraocular Movements: Extraocular movements intact.  Pulmonary:     Effort: Pulmonary effort is normal.  Feet:     Comments: Ingrown toe nail present for right great toe, tenderness at the medial aspect of the nail bed, no erythema, swelling or drainage noted. Capillary refill less than 3, sensation intact  Neurological:     Mental Status: She is alert and oriented to person, place, and time. Mental status is at baseline.  Psychiatric:        Mood and Affect: Mood normal.        Behavior: Behavior normal.     UC Treatments / Results  Labs (all labs ordered are listed, but only abnormal results are displayed) Labs Reviewed - No data to display  EKG   Radiology No results found.  Procedures Procedures (including critical care time)  Medications Ordered in UC Medications - No data to display  Initial Impression / Assessment and Plan / UC Course  I have reviewed the triage vital signs and the nursing notes.  Pertinent labs & imaging results that were available during my care of the patient were reviewed by me and considered in my medical decision making (see chart for details).  Ingrown toenail  Mild tenderness is present at affected site however  there are no signs of infection, recommended continued use of daily Neosporin, will help to manage pain, prescribed diclofenac gel and lidocaine gel for topical use,  refilled  tramadol prescription as this medication has been shown to be helpful, PDMP reviewed, low risk, given walking referral to podiatry for further management as all toenails are rough, hard and jagged, accompanied by daughter, patient and family verbalized understanding of treatment plan Final Clinical Impressions(s) / UC Diagnoses   Final diagnoses:  None  Discharge Instructions   None    ED Prescriptions   None    PDMP not reviewed this encounter.   Valinda Hoar, NP 10/27/21 [phone]

## 2021-11-01 DIAGNOSIS — B351 Tinea unguium: Secondary | ICD-10-CM | POA: Diagnosis not present

## 2021-11-01 DIAGNOSIS — M79674 Pain in right toe(s): Secondary | ICD-10-CM | POA: Diagnosis not present

## 2021-11-01 DIAGNOSIS — M216X1 Other acquired deformities of right foot: Secondary | ICD-10-CM | POA: Diagnosis not present

## 2021-11-01 DIAGNOSIS — L600 Ingrowing nail: Secondary | ICD-10-CM | POA: Diagnosis not present

## 2021-11-01 DIAGNOSIS — L851 Acquired keratosis [keratoderma] palmaris et plantaris: Secondary | ICD-10-CM | POA: Diagnosis not present

## 2021-11-01 DIAGNOSIS — M216X2 Other acquired deformities of left foot: Secondary | ICD-10-CM | POA: Diagnosis not present

## 2021-11-01 DIAGNOSIS — Q6671 Congenital pes cavus, right foot: Secondary | ICD-10-CM | POA: Diagnosis not present

## 2021-11-01 DIAGNOSIS — Q6672 Congenital pes cavus, left foot: Secondary | ICD-10-CM | POA: Diagnosis not present

## 2021-11-01 DIAGNOSIS — M2041 Other hammer toe(s) (acquired), right foot: Secondary | ICD-10-CM | POA: Diagnosis not present

## 2021-11-01 DIAGNOSIS — M2042 Other hammer toe(s) (acquired), left foot: Secondary | ICD-10-CM | POA: Diagnosis not present

## 2021-11-14 ENCOUNTER — Encounter: Payer: Medicare HMO | Admitting: Family Medicine

## 2021-11-15 ENCOUNTER — Other Ambulatory Visit: Payer: Medicare HMO

## 2021-11-30 ENCOUNTER — Encounter: Payer: Self-pay | Admitting: Orthopedic Surgery

## 2022-06-26 ENCOUNTER — Ambulatory Visit: Payer: Medicare HMO

## 2022-06-30 ENCOUNTER — Ambulatory Visit: Payer: Medicare HMO

## 2022-07-07 ENCOUNTER — Ambulatory Visit (INDEPENDENT_AMBULATORY_CARE_PROVIDER_SITE_OTHER): Payer: Medicare HMO

## 2022-07-07 DIAGNOSIS — Z0000 Encounter for general adult medical examination without abnormal findings: Secondary | ICD-10-CM

## 2022-07-07 NOTE — Patient Instructions (Signed)

## 2022-07-07 NOTE — Progress Notes (Signed)
I connected with  Connie Lee on 07/07/22 by a audio enabled telemedicine application and verified that I am speaking with the correct person using two identifiers.  Patient Location: Home  Provider Location: Office/Clinic  I discussed the limitations of evaluation and management by telemedicine. The patient expressed understanding and agreed to proceed.   Subjective:   Connie Lee is a 81 y.o. female who presents for Medicare Annual (Subsequent) preventive examination.  Review of Systems    Per HPI unless specifically indicated below.  Cardiac Risk Factors include: advanced age (>57men, >4 women);female gender        Objective:        10/27/2021    3:25 PM 10/27/2021    3:21 PM 10/13/2021   10:49 AM  Vitals with BMI  Height  5\' 5"  5\' 5"   Weight  150 lbs 135 lbs  BMI  24.96 22.47  Systolic 171  130  Diastolic 68  70  Pulse 73  80    There were no vitals filed for this visit. There is no height or weight on file to calculate BMI.     10/27/2021    3:24 PM 10/02/2021    5:57 PM 06/22/2021    8:27 AM 11/15/2020    1:24 PM 06/17/2019   11:14 AM  Advanced Directives  Does Patient Have a Medical Advance Directive? No No No No No  Would patient like information on creating a medical advance directive?  No - Patient declined Yes (MAU/Ambulatory/Procedural Areas - Information given)  No - Patient declined    Current Medications (verified) Outpatient Encounter Medications as of 07/07/2022  Medication Sig   calcium-vitamin D (OSCAL WITH D) 500-5 MG-MCG tablet Take 1 tablet by mouth 2 (two) times daily.   ferrous fumarate-b12-vitamic C-folic acid (TRINSICON / FOLTRIN) capsule Take 1 capsule by mouth 2 (two) times daily after a meal.   vitamin B-12 1000 MCG tablet Take 1 tablet (1,000 mcg total) by mouth daily.   [DISCONTINUED] diclofenac Sodium (VOLTAREN) 1 % GEL Apply 2 g topically 4 (four) times daily. (Patient not taking: Reported on 07/07/2022)    [DISCONTINUED] enoxaparin (LOVENOX) 30 MG/0.3ML injection Inject 0.3 mLs (30 mg total) into the skin daily for 14 days.   [DISCONTINUED] lidocaine-prilocaine (EMLA) cream Apply 1 application topically as needed. (Patient not taking: Reported on 07/07/2022)   [DISCONTINUED] methocarbamol (ROBAXIN) 500 MG tablet Take 1 tablet (500 mg total) by mouth every 6 (six) hours as needed for muscle spasms.   [DISCONTINUED] Vitamin D, Ergocalciferol, (DRISDOL) 1.25 MG (50000 UNIT) CAPS capsule Take 1 capsule (50,000 Units total) by mouth every 7 (seven) days. (Patient not taking: Reported on 07/07/2022)   No facility-administered encounter medications on file as of 07/07/2022.    Allergies (verified) Patient has no known allergies.   History: No past medical history on file. Past Surgical History:  Procedure Laterality Date   ABDOMINAL HYSTERECTOMY     APPENDECTOMY     HIP ARTHROPLASTY Left 10/03/2021   Procedure: ARTHROPLASTY BIPOLAR HIP (HEMIARTHROPLASTY);  Surgeon: 09/06/2022, MD;  Location: [facility] ORS;  Service: Orthopedics;  Laterality: Left;   No family history on file. Social History   Socioeconomic History   Marital status: Widowed    Spouse name: Not on file   Number of children: 3   Years of education: Not on file   Highest education level: Not on file  Occupational History   Occupation: Babysitter  Tobacco Use   Smoking status: Never  Smokeless tobacco: Never  Vaping Use   Vaping Use: Never used  Substance and Sexual Activity   Alcohol use: Not Currently   Drug use: Not Currently   Sexual activity: Not on file  Other Topics Concern   Not on file  Social History Narrative   Not on file   Social Determinants of Health   Financial Resource Strain: Low Risk  (07/07/2022)   Overall Financial Resource Strain (CARDIA)    Difficulty of Paying Living Expenses: Not hard at all  Food Insecurity: No Food Insecurity (07/07/2022)   Hunger Vital Sign    Worried About Running Out of Food  in the Last Year: Never true    Ran Out of Food in the Last Year: Never true  Transportation Needs: No Transportation Needs (07/07/2022)   PRAPARE - Administrator, Civil Service (Medical): No    Lack of Transportation (Non-Medical): No  Physical Activity: Sufficiently Active (07/07/2022)   Exercise Vital Sign    Days of Exercise per Week: 5 days    Minutes of Exercise per Session: 30 min  Stress: No Stress Concern Present (07/07/2022)   [employer] of Occupational Health - Occupational Stress Questionnaire    Feeling of Stress : Not at all  Social Connections: Moderately Isolated (07/07/2022)   Social Connection and Isolation Panel [NHANES]    Frequency of Communication with Friends and Family: Twice a week    Frequency of Social Gatherings with Friends and Family: More than three times a week    Attends Religious Services: More than 4 times per year    Active Member of [employer] or Organizations: No    Attends Banker Meetings: Never    Marital Status: Widowed    Tobacco Counseling No smoking counseling needed.  Clinical Intake:  Pre-visit preparation completed: No  Pain : No/denies pain     Nutritional Status: BMI of 19-24  Normal Nutritional Risks: None Diabetes: No  How often do you need to have someone help you when you read instructions, pamphlets, or other written materials from your doctor or pharmacy?: 1 - Never  Diabetic?No  Interpreter Needed?: No  Information entered by :: Laurel Dimmer, CMA   Activities of Daily Living    07/07/2022    1:40 PM 10/03/2021    4:55 AM  In your present state of health, do you have any difficulty performing the following activities:  Hearing? 0 0  Vision? 1 0  Difficulty concentrating or making decisions? 0 0  Walking or climbing stairs? 1 0  Dressing or bathing? 0 0  Doing errands, shopping? 0 0    Patient Care Team: Duanne Limerick, MD as PCP - General (Family Medicine)  Indicate any recent  Medical Services you may have received from other than [facility] providers in the past year (date may be approximate). The pt was seen at [facility] on 10/27/21 for a Ingrown toe nail.    Assessment:   This is a routine wellness examination for Connie Lee.  Hearing/Vision screen Denies any issues with her hearing. Denies any vision issues, but she wear glasses. She reports her last eye exam was over 2 yrs ago.   Dietary issues and exercise activities discussed: Current Exercise Habits: Structured exercise class;Home exercise routine, Type of exercise: Other - see comments, Time (Minutes): 20, Frequency (Times/Week): 5, Weekly Exercise (Minutes/Week): 100, Intensity: Intense, Exercise limited by: orthopedic condition(s)   Goals Addressed  This Visit's Progress    Stay Active and Independent       Why is this important?   Regular activity or exercise is important to managing back pain.  Activity helps to keep your muscles strong.  You will sleep better and feel more relaxed.  You will have more energy and feel less stressed.  If you are not active now, start slowly. Little changes make a big difference.  Rest, but not too much.  Stay as active as you can and listen to your body's signals.            Depression Screen    07/07/2022    1:38 PM 07/07/2022    1:37 PM 10/13/2021   10:52 AM 06/22/2021    8:26 AM 12/07/2020   10:20 AM 11/12/2020    2:14 PM  PHQ 2/9 Scores  PHQ - 2 Score 0 0 0 0 0 0  PHQ- 9 Score   0  0     Fall Risk    07/07/2022    1:39 PM 10/13/2021   10:52 AM 06/22/2021    8:29 AM  Fall Risk   Falls in the past year? 1 1 1   Number falls in past yr: 0 1 0  Injury with Fall? 1 1 1   Risk for fall due to : Impaired balance/gait;Impaired mobility Impaired balance/gait;History of fall(s) History of fall(s)  Follow up Falls evaluation completed Falls evaluation completed Falls prevention discussed    FALL RISK PREVENTION PERTAINING TO THE HOME:  Any  stairs in or around the home? No  If so, are there any without handrails? No  Home free of loose throw rugs in walkways, pet beds, electrical cords, etc? Yes  Adequate lighting in your home to reduce risk of falls? Yes   ASSISTIVE DEVICES UTILIZED TO PREVENT FALLS:  Life alert? No  Use of a cane, walker or w/c? Yes  Grab bars in the bathroom? Yes  Shower chair or bench in shower? Yes  Elevated toilet seat or a handicapped toilet? Yes   TIMED UP AND GO:  Was the test performed?  virtual appt .  Cognitive Function:        07/07/2022    1:42 PM  6CIT Screen  What Year? 0 points  What month? 0 points  What time? 0 points  Count back from 20 0 points  Months in reverse 0 points  Repeat phrase 2 points  Total Score 2 points    Immunizations  There is no immunization history on file for this patient.  TDAP status: Due, Education has been provided regarding the importance of this vaccine. Advised may receive this vaccine at local pharmacy or Health Dept. Aware to provide a copy of the vaccination record if obtained from local pharmacy or Health Dept. Verbalized acceptance and understanding.  Flu Vaccine status: Due, Education has been provided regarding the importance of this vaccine. Advised may receive this vaccine at local pharmacy or Health Dept. Aware to provide a copy of the vaccination record if obtained from local pharmacy or Health Dept. Verbalized acceptance and understanding.  Pneumococcal vaccine status: Due, Education has been provided regarding the importance of this vaccine. Advised may receive this vaccine at local pharmacy or Health Dept. Aware to provide a copy of the vaccination record if obtained from local pharmacy or Health Dept. Verbalized acceptance and understanding.  Covid-19 vaccine status: Declined, Education has been provided regarding the importance of this vaccine but patient still declined. Advised may  receive this vaccine at local pharmacy or Health  Dept.or vaccine clinic. Aware to provide a copy of the vaccination record if obtained from local pharmacy or Health Dept. Verbalized acceptance and understanding.  Qualifies for Shingles Vaccine? Yes   Zostavax completed No   Shingrix Completed?: No.    Education has been provided regarding the importance of this vaccine. Patient has been advised to call insurance company to determine out of pocket expense if they have not yet received this vaccine. Advised may also receive vaccine at local pharmacy or Health Dept. Verbalized acceptance and understanding.  Screening Tests Health Maintenance  Topic Date Due   COVID-19 Vaccine (1) Never done   TETANUS/TDAP  Never done   Zoster Vaccines- Shingrix (1 of 2) Never done   Pneumonia Vaccine 43+ Years old (1 - PCV) Never done   DEXA SCAN  Never done   INFLUENZA VACCINE  Never done   HPV VACCINES  Aged Out    Health Maintenance  Health Maintenance Due  Topic Date Due   COVID-19 Vaccine (1) Never done   TETANUS/TDAP  Never done   Zoster Vaccines- Shingrix (1 of 2) Never done   Pneumonia Vaccine 70+ Years old (1 - PCV) Never done   DEXA SCAN  Never done   INFLUENZA VACCINE  Never done    Colorectal cancer screening: No longer required.   Mammogram status: No longer required due to age.  DEXA Scan: ordered 10/13/2021  Lung Cancer Screening: (Low Dose CT Chest recommended if Age 61-80 years, 30 pack-year currently smoking OR have quit w/in 15years.) does not qualify.    Additional Screening:  Hepatitis C Screening: does not qualify;no longer required because of age.   Vision Screening: Recommended annual ophthalmology exams for early detection of glaucoma and other disorders of the eye. Is the patient up to date with their annual eye exam?  No  Who is the provider or what is the name of the office in which the patient attends annual eye exams?  If pt is not established with a provider, would they like to be referred to a provider to  establish care? No .   Dental Screening: Recommended annual dental exams for proper oral hygiene  Community Resource Referral / Chronic Care Management: CRR required this visit?  No   CCM required this visit?  No      Plan:     I have personally reviewed and noted the following in the patient's chart:   Medical and social history Use of alcohol, tobacco or illicit drugs  Current medications and supplements including opioid prescriptions. Patient is not currently taking opioid prescriptions. Functional ability and status Nutritional status Physical activity Advanced directives List of other physicians Hospitalizations, surgeries, and ER visits in previous 12 months Vitals Screenings to include cognitive, depression, and falls Referrals and appointments  In addition, I have reviewed and discussed with patient certain preventive protocols, quality metrics, and best practice recommendations. A written personalized care plan for preventive services as well as general preventive health recommendations were provided to patient.    Ms. Luchsinger , Thank you for taking time to come for your Medicare Wellness Visit. I appreciate your ongoing commitment to your health goals. Please review the following plan we discussed and let me know if I can assist you in the future.   These are the goals we discussed:  Goals      Stay Active and Independent     Why is this important?  Regular activity or exercise is important to managing back pain.  Activity helps to keep your muscles strong.  You will sleep better and feel more relaxed.  You will have more energy and feel less stressed.  If you are not active now, start slowly. Little changes make a big difference.  Rest, but not too much.  Stay as active as you can and listen to your body's signals.             This is a list of the screening recommended for you and due dates:  Health Maintenance  Topic Date Due   COVID-19 Vaccine  (1) Never done   Tetanus Vaccine  Never done   Zoster (Shingles) Vaccine (1 of 2) Never done   Pneumonia Vaccine (1 - PCV) Never done   DEXA scan (bone density measurement)  Never done   Flu Shot  Never done   HPV Vaccine  [address]      Najat Olazabal L Sharetha Newson, [place]   07/07/2022   Nurse Notes: Approximately 30 minute Non-Face-to=Face Visit

## 2023-03-22 DIAGNOSIS — H2513 Age-related nuclear cataract, bilateral: Secondary | ICD-10-CM | POA: Diagnosis not present

## 2023-03-22 DIAGNOSIS — H18593 Other hereditary corneal dystrophies, bilateral: Secondary | ICD-10-CM | POA: Diagnosis not present

## 2023-03-22 DIAGNOSIS — H353132 Nonexudative age-related macular degeneration, bilateral, intermediate dry stage: Secondary | ICD-10-CM | POA: Diagnosis not present

## 2023-04-18 DIAGNOSIS — H538 Other visual disturbances: Secondary | ICD-10-CM | POA: Diagnosis not present

## 2023-04-18 DIAGNOSIS — H43813 Vitreous degeneration, bilateral: Secondary | ICD-10-CM | POA: Diagnosis not present

## 2023-04-18 DIAGNOSIS — H353132 Nonexudative age-related macular degeneration, bilateral, intermediate dry stage: Secondary | ICD-10-CM | POA: Diagnosis not present

## 2023-04-18 DIAGNOSIS — H2513 Age-related nuclear cataract, bilateral: Secondary | ICD-10-CM | POA: Diagnosis not present

## 2023-04-18 DIAGNOSIS — H40003 Preglaucoma, unspecified, bilateral: Secondary | ICD-10-CM | POA: Diagnosis not present

## 2023-04-18 DIAGNOSIS — H2512 Age-related nuclear cataract, left eye: Secondary | ICD-10-CM | POA: Diagnosis not present

## 2023-04-25 DIAGNOSIS — H2512 Age-related nuclear cataract, left eye: Secondary | ICD-10-CM | POA: Diagnosis not present

## 2023-04-25 DIAGNOSIS — H2511 Age-related nuclear cataract, right eye: Secondary | ICD-10-CM | POA: Diagnosis not present

## 2023-05-02 ENCOUNTER — Encounter: Payer: Self-pay | Admitting: Ophthalmology

## 2023-05-02 NOTE — Anesthesia Preprocedure Evaluation (Addendum)
Anesthesia Evaluation  Patient identified by MRN, date of birth, ID band Patient awake    Reviewed: Allergy & Precautions, H&P , NPO status , Patient's Chart, lab work & pertinent test results  Airway Mallampati: III  TM Distance: >3 FB Neck ROM: Full    Dental no notable dental hx.    Pulmonary neg pulmonary ROS   Pulmonary exam normal breath sounds clear to auscultation       Cardiovascular negative cardio ROS Normal cardiovascular exam Rhythm:Regular Rate:Normal     Neuro/Psych negative neurological ROS  negative psych ROS   GI/Hepatic negative GI ROS, Neg liver ROS,,,  Endo/Other  negative endocrine ROS    Renal/GU negative Renal ROS  negative genitourinary   Musculoskeletal negative musculoskeletal ROS (+)    Abdominal   Peds negative pediatric ROS (+)  Hematology negative hematology ROS (+)   Anesthesia Other Findings Osteoarthritis   Reproductive/Obstetrics negative OB ROS                             Anesthesia Physical Anesthesia Plan  ASA: 1  Anesthesia Plan: MAC   Post-op Pain Management:    Induction: Intravenous  PONV Risk Score and Plan:   Airway Management Planned: Natural Airway and Nasal Cannula  Additional Equipment:   Intra-op Plan:   Post-operative Plan:   Informed Consent: I have reviewed the patients History and Physical, chart, labs and discussed the procedure including the risks, benefits and alternatives for the proposed anesthesia with the patient or authorized representative who has indicated his/her understanding and acceptance.     Dental Advisory Given  Plan Discussed with: Anesthesiologist, CRNA and Surgeon  Anesthesia Plan Comments: (Patient consented for risks of anesthesia including but not limited to:  - adverse reactions to medications - damage to eyes, teeth, lips or other oral mucosa - nerve damage due to positioning  - sore  throat or hoarseness - Damage to heart, brain, nerves, lungs, other parts of body or loss of life  Patient voiced understanding.)        Anesthesia Quick Evaluation

## 2023-05-07 NOTE — Discharge Instructions (Signed)

## 2023-05-09 ENCOUNTER — Ambulatory Visit: Payer: Medicare HMO | Admitting: Anesthesiology

## 2023-05-09 ENCOUNTER — Other Ambulatory Visit: Payer: Self-pay

## 2023-05-09 ENCOUNTER — Encounter
Admission: RE | Disposition: A | Discharge status: Home / Self Care | Payer: Self-pay | Source: Ambulatory Visit | Attending: Ophthalmology

## 2023-05-09 ENCOUNTER — Ambulatory Visit
Admission: RE | Admit: 2023-05-09 | Discharge: 2023-05-09 | Disposition: A | Discharge status: Home / Self Care | Payer: Medicare HMO | Source: Ambulatory Visit | Attending: Ophthalmology | Admitting: Ophthalmology

## 2023-05-09 ENCOUNTER — Encounter: Payer: Self-pay | Admitting: Ophthalmology

## 2023-05-09 DIAGNOSIS — H2511 Age-related nuclear cataract, right eye: Secondary | ICD-10-CM | POA: Diagnosis not present

## 2023-05-09 DIAGNOSIS — M1990 Unspecified osteoarthritis, unspecified site: Secondary | ICD-10-CM | POA: Diagnosis not present

## 2023-05-09 HISTORY — DX: Other specified health status: Z78.9

## 2023-05-09 HISTORY — PX: CATARACT EXTRACTION W/PHACO: SHX586

## 2023-05-09 SURGERY — PHACOEMULSIFICATION, CATARACT, WITH IOL INSERTION
Anesthesia: Monitor Anesthesia Care | Site: Eye | Laterality: Right

## 2023-05-09 MED ORDER — SIGHTPATH DOSE#1 BSS IO SOLN
INTRAOCULAR | Status: DC | PRN
  Administered 2023-05-09: 15 mL

## 2023-05-09 MED ORDER — MIDAZOLAM HCL 2 MG/2ML IJ SOLN
INTRAMUSCULAR | Status: DC | PRN
  Administered 2023-05-09 (×2): 1 mg via INTRAVENOUS

## 2023-05-09 MED ORDER — SODIUM CHLORIDE 0.9% FLUSH
INTRAVENOUS | Status: DC | PRN
  Administered 2023-05-09: 20 mL via INTRAVENOUS

## 2023-05-09 MED ORDER — SIGHTPATH DOSE#1 BSS IO SOLN
INTRAOCULAR | Status: DC | PRN
  Administered 2023-05-09: 1 mL

## 2023-05-09 MED ORDER — ARMC OPHTHALMIC DILATING DROPS
1.0000 | OPHTHALMIC | Status: DC | PRN
  Administered 2023-05-09 (×3): 1 via OPHTHALMIC

## 2023-05-09 MED ORDER — BRIMONIDINE TARTRATE-TIMOLOL 0.2-0.5 % OP SOLN
OPHTHALMIC | Status: DC | PRN
  Administered 2023-05-09: 1 [drp] via OPHTHALMIC

## 2023-05-09 MED ORDER — TETRACAINE HCL 0.5 % OP SOLN
1.0000 [drp] | OPHTHALMIC | Status: DC | PRN
  Administered 2023-05-09 (×3): 1 [drp] via OPHTHALMIC

## 2023-05-09 MED ORDER — CEFUROXIME OPHTHALMIC INJECTION 1 MG/0.1 ML
INJECTION | OPHTHALMIC | Status: DC | PRN
  Administered 2023-05-09: .1 mL via INTRACAMERAL

## 2023-05-09 MED ORDER — SIGHTPATH DOSE#1 NA HYALUR & NA CHOND-NA HYALUR IO KIT
PACK | INTRAOCULAR | Status: DC | PRN
  Administered 2023-05-09: 1 via OPHTHALMIC

## 2023-05-09 MED ORDER — DEXMEDETOMIDINE HCL IN NACL 200 MCG/50ML IV SOLN
INTRAVENOUS | Status: DC | PRN
  Administered 2023-05-09: 4 ug via INTRAVENOUS

## 2023-05-09 MED ORDER — SIGHTPATH DOSE#1 BSS IO SOLN
INTRAOCULAR | Status: DC | PRN
  Administered 2023-05-09: 57 mL via OPHTHALMIC

## 2023-05-09 MED ORDER — LACTATED RINGERS IV SOLN: INTRAVENOUS | Status: DC

## 2023-05-09 MED ORDER — FENTANYL CITRATE (PF) 100 MCG/2ML IJ SOLN
INTRAMUSCULAR | Status: DC | PRN
  Administered 2023-05-09: 25 ug via INTRAVENOUS
  Administered 2023-05-09: 75 ug via INTRAVENOUS

## 2023-05-09 SURGICAL SUPPLY — 18 items
CANNULA ANT/CHMB 27G (MISCELLANEOUS) IMPLANT
CANNULA ANT/CHMB 27GA (MISCELLANEOUS) IMPLANT
CATARACT SUITE SIGHTPATH (MISCELLANEOUS) ×1 IMPLANT
FEE CATARACT SUITE SIGHTPATH (MISCELLANEOUS) ×1 IMPLANT
GLOVE SRG 8 PF TXTR STRL LF DI (GLOVE) ×1 IMPLANT
GLOVE SURG ENC TEXT LTX SZ7.5 (GLOVE) ×1 IMPLANT
GLOVE SURG GAMMEX PI TX LF 7.5 (GLOVE) IMPLANT
GLOVE SURG UNDER POLY LF SZ8 (GLOVE) ×1
LENS IOL TECNIS EYHANCE 24.0 (Intraocular Lens) IMPLANT
NDL FILTER BLUNT 18X1 1/2 (NEEDLE) ×1 IMPLANT
NDL RETROBULBAR .5 NSTRL (NEEDLE) IMPLANT
NEEDLE FILTER BLUNT 18X1 1/2 (NEEDLE) ×1 IMPLANT
PACK VIT ANT 23G (MISCELLANEOUS) IMPLANT
RING MALYGIN 7.0 (MISCELLANEOUS) IMPLANT
SUT ETHILON 10-0 CS-B-6CS-B-6 (SUTURE)
SUT VICRYL 9 0 (SUTURE) IMPLANT
SUTURE EHLN 10-0 CS-B-6CS-B-6 (SUTURE) IMPLANT
SYR 3ML LL SCALE MARK (SYRINGE) ×1 IMPLANT

## 2023-05-09 NOTE — Anesthesia Postprocedure Evaluation (Signed)
Anesthesia Post Note  Patient: Connie Lee  Procedure(s) Performed: CATARACT EXTRACTION PHACO AND INTRAOCULAR LENS PLACEMENT (IOC) RIGHT 7.87 00:38.8 (Right: Eye)  Patient location during evaluation: PACU Anesthesia Type: MAC Level of consciousness: awake and alert Pain management: pain level controlled Vital Signs Assessment: post-procedure vital signs reviewed and stable Respiratory status: spontaneous breathing, nonlabored ventilation, respiratory function stable and patient connected to nasal cannula oxygen Cardiovascular status: stable and blood pressure returned to baseline Postop Assessment: no apparent nausea or vomiting Anesthetic complications: no   No notable events documented.   Last Vitals:  Vitals:   05/09/23 1337 05/09/23 1339  BP: 131/60   Pulse: 68 66  Resp: 18 15  Temp:    SpO2: 97% 96%    Last Pain:  Vitals:   05/09/23 1337  TempSrc:   PainSc: 0-No pain                 Jiovanny Burdell C Izic Stfort

## 2023-05-09 NOTE — H&P (Signed)
[facility]   Primary Care Physician:  Duanne Limerick, MD Ophthalmologist: Dr. Lockie Mola  Pre-Procedure History & Physical: HPI:  Connie Lee is a 82 y.o. female here for ophthalmic surgery.   Past Medical History:  Diagnosis Date   Medical history non-contributory     Past Surgical History:  Procedure Laterality Date   ABDOMINAL HYSTERECTOMY     APPENDECTOMY     HIP ARTHROPLASTY Left 10/03/2021   Procedure: ARTHROPLASTY BIPOLAR HIP (HEMIARTHROPLASTY);  Surgeon: Juanell Fairly, MD;  Location: [facility] ORS;  Service: Orthopedics;  Laterality: Left;    Prior to Admission medications   Medication Sig Start Date End Date Taking? Authorizing Provider  Multiple Vitamins-Minerals (PRESERVISION AREDS PO) Take by mouth daily.   Yes [provider]  vitamin B-12 1000 MCG tablet Take 1 tablet (1,000 mcg total) by mouth daily. 10/06/21   Arnetha Courser, MD    Allergies as of 04/19/2023   (No Known Allergies)    History reviewed. No pertinent family history.  Social History   Socioeconomic History   Marital status: Widowed    Spouse name: Not on file   Number of children: 3   Years of education: Not on file   Highest education level: Not on file  Occupational History   Occupation: Dispensing optician  Tobacco Use   Smoking status: Never   Smokeless tobacco: Never  Vaping Use   Vaping Use: Never used  Substance and Sexual Activity   Alcohol use: Not Currently   Drug use: Not Currently   Sexual activity: Not on file  Other Topics Concern   Not on file  Social History Narrative   Not on file   Social Determinants of Health   Financial Resource Strain: Low Risk  (07/07/2022)   Overall Financial Resource Strain (CARDIA)    Difficulty of Paying Living Expenses: Not hard at all  Food Insecurity: No Food Insecurity (07/07/2022)   Hunger Vital Sign    Worried About Running Out of Food in the Last Year: Never true    Ran Out of Food in the Last Year:  Never true  Transportation Needs: No Transportation Needs (07/07/2022)   PRAPARE - Administrator, Civil Service (Medical): No    Lack of Transportation (Non-Medical): No  Physical Activity: Sufficiently Active (07/07/2022)   Exercise Vital Sign    Days of Exercise per Week: 5 days    Minutes of Exercise per Session: 30 min  Stress: No Stress Concern Present (07/07/2022)   [employer] of Occupational Health - Occupational Stress Questionnaire    Feeling of Stress : Not at all  Social Connections: Moderately Isolated (07/07/2022)   Social Connection and Isolation Panel [NHANES]    Frequency of Communication with Friends and Family: Twice a week    Frequency of Social Gatherings with Friends and Family: More than three times a week    Attends Religious Services: More than 4 times per year    Active Member of [employer] or Organizations: No    Attends Banker Meetings: Never    Marital Status: Widowed  Intimate Partner Violence: Not At Risk (07/07/2022)   Humiliation, Afraid, Rape, and Kick questionnaire    Fear of Current or Ex-Partner: No    Emotionally Abused: No    Physically Abused: No    Sexually Abused: No    Review of Systems: See HPI, otherwise negative ROS  Physical Exam: Ht 5\' 5"  (1.651 m)   Wt 63.5 kg  BMI 23.30 kg/m  General:   Alert,  pleasant and cooperative in NAD Head:  Normocephalic and atraumatic. Lungs:  Clear to auscultation.    Heart:  Regular rate and rhythm.   Impression/Plan: [name] is here for ophthalmic surgery.  Risks, benefits, limitations, and alternatives regarding ophthalmic surgery have been reviewed with the patient.  Questions have been answered.  All parties agreeable.   Lockie Mola, MD  05/09/2023, 11:29 AM

## 2023-05-09 NOTE — Transfer of Care (Signed)
Immediate Anesthesia Transfer of Care Note  Patient: Connie Lee  Procedure(s) Performed: CATARACT EXTRACTION PHACO AND INTRAOCULAR LENS PLACEMENT (IOC) RIGHT 7.87 00:38.8 (Right: Eye)  Patient Location: PACU  Anesthesia Type:MAC  Level of Consciousness: awake, alert , and oriented  Airway & Oxygen Therapy: Patient Spontanous Breathing  Post-op Assessment: Report given to RN and Post -op Vital signs reviewed and stable  Post vital signs: Reviewed and stable  Last Vitals:  See PACU flow sheet Vitals Value Taken Time  BP    Temp    Pulse    Resp    SpO2      Last Pain:  Vitals:   05/09/23 1141  TempSrc: Temporal  PainSc: 0-No pain         Complications: No notable events documented.

## 2023-05-09 NOTE — Op Note (Signed)
LOCATION:  [facility]   PREOPERATIVE DIAGNOSIS:    Nuclear sclerotic cataract right eye. H25.11   POSTOPERATIVE DIAGNOSIS:  Nuclear sclerotic cataract right eye.     PROCEDURE:  Phacoemusification with posterior chamber intraocular lens placement of the right eye   ULTRASOUND TIME: Procedure(s): CATARACT EXTRACTION PHACO AND INTRAOCULAR LENS PLACEMENT (IOC) RIGHT 7.87 00:38.8 (Right)  LENS:   Implant Name Type Inv. Item Serial No. Manufacturer Lot No. LRB No. Used Action  LENS IOL TECNIS EYHANCE 24.0 - [id number] Intraocular Lens LENS IOL TECNIS EYHANCE 24.0 [id number] [employer]  Right 1 Implanted         SURGEON:  Deirdre Evener, MD   ANESTHESIA:  Topical with tetracaine drops and 2% Xylocaine jelly, augmented with 1% preservative-free intracameral lidocaine.    COMPLICATIONS:  None.   DESCRIPTION OF PROCEDURE:  The patient was identified in the holding room and transported to the operating room and placed in the supine position under the operating microscope.  The right eye was identified as the operative eye and it was prepped and draped in the usual sterile ophthalmic fashion.   A 1 millimeter clear-corneal paracentesis was made at the 12:00 position.  0.5 ml of preservative-free 1% lidocaine was injected into the anterior chamber. The anterior chamber was filled with Viscoat viscoelastic.  A 2.4 millimeter keratome was used to make a near-clear corneal incision at the 9:00 position.  A curvilinear capsulorrhexis was made with a cystotome and capsulorrhexis forceps.  Balanced salt solution was used to hydrodissect and hydrodelineate the nucleus.   Phacoemulsification was then used in stop and chop fashion to remove the lens nucleus and epinucleus.  The remaining cortex was then removed using the irrigation and aspiration handpiece. Provisc was then placed into the capsular bag to distend it for lens placement.  A lens was then injected into the capsular bag.  The  remaining viscoelastic was aspirated.   Wounds were hydrated with balanced salt solution.  The anterior chamber was inflated to a physiologic pressure with balanced salt solution.  No wound leaks were noted. Cefuroxime 0.1 ml of a 10mg /ml solution was injected into the anterior chamber for a dose of 1 mg of intracameral antibiotic at the completion of the case.   Timolol and Brimonidine drops and Maxitrol ointment were applied to the eye.  The patient was taken to the recovery room in stable condition without complications of anesthesia or surgery.   Abbygayle Helfand 05/09/2023, 1:29 PM

## 2023-05-10 ENCOUNTER — Encounter: Payer: Self-pay | Admitting: Ophthalmology

## 2023-05-14 ENCOUNTER — Encounter: Payer: Self-pay | Admitting: Ophthalmology

## 2023-05-14 NOTE — Discharge Instructions (Signed)

## 2023-05-14 NOTE — Anesthesia Preprocedure Evaluation (Signed)
Anesthesia Evaluation  Patient identified by MRN, date of birth, ID band Patient awake    Reviewed: Allergy & Precautions, H&P , NPO status , Patient's Chart, lab work & pertinent test results  Airway Mallampati: III  TM Distance: >3 FB Neck ROM: Full    Dental no notable dental hx.    Pulmonary neg pulmonary ROS   Pulmonary exam normal breath sounds clear to auscultation       Cardiovascular negative cardio ROS Normal cardiovascular exam Rhythm:Regular Rate:Normal     Neuro/Psych negative neurological ROS  negative psych ROS   GI/Hepatic negative GI ROS, Neg liver ROS,,,  Endo/Other  negative endocrine ROS    Renal/GU negative Renal ROS  negative genitourinary   Musculoskeletal negative musculoskeletal ROS (+)    Abdominal   Peds negative pediatric ROS (+)  Hematology negative hematology ROS (+)   Anesthesia Other Findings Previous cataract surgery 05-09-23   Reproductive/Obstetrics negative OB ROS                             Anesthesia Physical Anesthesia Plan  ASA: 1  Anesthesia Plan: MAC   Post-op Pain Management:    Induction: Intravenous  PONV Risk Score and Plan:   Airway Management Planned: Natural Airway and Nasal Cannula  Additional Equipment:   Intra-op Plan:   Post-operative Plan:   Informed Consent: I have reviewed the patients History and Physical, chart, labs and discussed the procedure including the risks, benefits and alternatives for the proposed anesthesia with the patient or authorized representative who has indicated his/her understanding and acceptance.     Dental Advisory Given  Plan Discussed with: Anesthesiologist, CRNA and Surgeon  Anesthesia Plan Comments: (Patient consented for risks of anesthesia including but not limited to:  - adverse reactions to medications - damage to eyes, teeth, lips or other oral mucosa - nerve damage due to  positioning  - sore throat or hoarseness - Damage to heart, brain, nerves, lungs, other parts of body or loss of life  Patient voiced understanding.)        Anesthesia Quick Evaluation

## 2023-05-15 DIAGNOSIS — H40003 Preglaucoma, unspecified, bilateral: Secondary | ICD-10-CM | POA: Diagnosis not present

## 2023-05-15 DIAGNOSIS — H538 Other visual disturbances: Secondary | ICD-10-CM | POA: Diagnosis not present

## 2023-05-15 DIAGNOSIS — H2512 Age-related nuclear cataract, left eye: Secondary | ICD-10-CM | POA: Diagnosis not present

## 2023-05-16 ENCOUNTER — Encounter
Admission: RE | Disposition: A | Discharge status: Home / Self Care | Payer: Self-pay | Source: Ambulatory Visit | Attending: Ophthalmology

## 2023-05-16 ENCOUNTER — Ambulatory Visit: Payer: Self-pay | Admitting: Anesthesiology

## 2023-05-16 ENCOUNTER — Ambulatory Visit
Admission: RE | Admit: 2023-05-16 | Discharge: 2023-05-16 | Disposition: A | Discharge status: Home / Self Care | Payer: Medicare HMO | Source: Ambulatory Visit | Attending: Ophthalmology | Admitting: Ophthalmology

## 2023-05-16 ENCOUNTER — Encounter: Payer: Self-pay | Admitting: Ophthalmology

## 2023-05-16 ENCOUNTER — Ambulatory Visit: Payer: Self-pay | Admitting: *Deleted

## 2023-05-16 ENCOUNTER — Ambulatory Visit: Payer: Medicare HMO | Admitting: Anesthesiology

## 2023-05-16 ENCOUNTER — Other Ambulatory Visit: Payer: Self-pay

## 2023-05-16 DIAGNOSIS — H2512 Age-related nuclear cataract, left eye: Secondary | ICD-10-CM | POA: Diagnosis not present

## 2023-05-16 DIAGNOSIS — H269 Unspecified cataract: Secondary | ICD-10-CM | POA: Diagnosis not present

## 2023-05-16 HISTORY — PX: CATARACT EXTRACTION W/PHACO: SHX586

## 2023-05-16 SURGERY — PHACOEMULSIFICATION, CATARACT, WITH IOL INSERTION
Anesthesia: Monitor Anesthesia Care | Laterality: Left

## 2023-05-16 MED ORDER — ARMC OPHTHALMIC DILATING DROPS
1.0000 | OPHTHALMIC | Status: DC | PRN
  Administered 2023-05-16 (×3): 1 via OPHTHALMIC

## 2023-05-16 MED ORDER — SIGHTPATH DOSE#1 BSS IO SOLN
INTRAOCULAR | Status: DC | PRN
  Administered 2023-05-16: 77 mL via OPHTHALMIC

## 2023-05-16 MED ORDER — MIDAZOLAM HCL 2 MG/2ML IJ SOLN
INTRAMUSCULAR | Status: DC | PRN
  Administered 2023-05-16: 2 mg via INTRAVENOUS

## 2023-05-16 MED ORDER — CEFUROXIME OPHTHALMIC INJECTION 1 MG/0.1 ML
INJECTION | OPHTHALMIC | Status: DC | PRN
  Administered 2023-05-16: 1 mg via INTRACAMERAL

## 2023-05-16 MED ORDER — BRIMONIDINE TARTRATE-TIMOLOL 0.2-0.5 % OP SOLN
OPHTHALMIC | Status: DC | PRN
  Administered 2023-05-16: 1 [drp] via OPHTHALMIC

## 2023-05-16 MED ORDER — SIGHTPATH DOSE#1 BSS IO SOLN
INTRAOCULAR | Status: DC | PRN
  Administered 2023-05-16: 15 mL via INTRAOCULAR

## 2023-05-16 MED ORDER — TETRACAINE HCL 0.5 % OP SOLN
1.0000 [drp] | OPHTHALMIC | Status: DC | PRN
  Administered 2023-05-16 (×3): 1 [drp] via OPHTHALMIC

## 2023-05-16 MED ORDER — SIGHTPATH DOSE#1 NA HYALUR & NA CHOND-NA HYALUR IO KIT
PACK | INTRAOCULAR | Status: DC | PRN
  Administered 2023-05-16: 1 via OPHTHALMIC

## 2023-05-16 MED ORDER — LACTATED RINGERS IV SOLN: INTRAVENOUS | Status: DC

## 2023-05-16 MED ORDER — SIGHTPATH DOSE#1 BSS IO SOLN
INTRAOCULAR | Status: DC | PRN
  Administered 2023-05-16: 2 mL

## 2023-05-16 SURGICAL SUPPLY — 9 items
CATARACT SUITE SIGHTPATH (MISCELLANEOUS) ×1 IMPLANT
FEE CATARACT SUITE SIGHTPATH (MISCELLANEOUS) ×1 IMPLANT
GLOVE SRG 8 PF TXTR STRL LF DI (GLOVE) ×1 IMPLANT
GLOVE SURG ENC TEXT LTX SZ7.5 (GLOVE) ×1 IMPLANT
GLOVE SURG UNDER POLY LF SZ8 (GLOVE) ×1
LENS IOL TECNIS EYHANCE 23.5 (Intraocular Lens) IMPLANT
NDL FILTER BLUNT 18X1 1/2 (NEEDLE) ×1 IMPLANT
NEEDLE FILTER BLUNT 18X1 1/2 (NEEDLE) ×1 IMPLANT
SYR 3ML LL SCALE MARK (SYRINGE) ×1 IMPLANT

## 2023-05-16 NOTE — H&P (Signed)
[facility]   Primary Care Physician:  Duanne Limerick, MD Ophthalmologist: Dr. Lockie Mola  Pre-Procedure History & Physical: HPI:  Connie Lee is a 82 y.o. female here for ophthalmic surgery.   Past Medical History:  Diagnosis Date   Medical history non-contributory     Past Surgical History:  Procedure Laterality Date   ABDOMINAL HYSTERECTOMY     APPENDECTOMY     CATARACT EXTRACTION W/PHACO Right 05/09/2023   Procedure: CATARACT EXTRACTION PHACO AND INTRAOCULAR LENS PLACEMENT (IOC) RIGHT 7.87 00:38.8;  Surgeon: Lockie Mola, MD;  Location: [facility];  Service: Ophthalmology;  Laterality: Right;   HIP ARTHROPLASTY Left 10/03/2021   Procedure: ARTHROPLASTY BIPOLAR HIP (HEMIARTHROPLASTY);  Surgeon: Juanell Fairly, MD;  Location: [facility];  Service: Orthopedics;  Laterality: Left;    Prior to Admission medications   Medication Sig Start Date End Date Taking? Authorizing Provider  Multiple Vitamins-Minerals (PRESERVISION AREDS PO) Take by mouth daily.   Yes [provider]  vitamin B-12 1000 MCG tablet Take 1 tablet (1,000 mcg total) by mouth daily. 10/06/21  Yes Arnetha Courser, MD    Allergies as of 04/19/2023   (No Known Allergies)    History reviewed. No pertinent family history.  Social History   Socioeconomic History   Marital status: Widowed    Spouse name: Not on file   Number of children: 3   Years of education: Not on file   Highest education level: Not on file  Occupational History   Occupation: Dispensing optician  Tobacco Use   Smoking status: Never   Smokeless tobacco: Never  Vaping Use   Vaping status: Never Used  Substance and Sexual Activity   Alcohol use: Not Currently   Drug use: Not Currently   Sexual activity: Not on file  Other Topics Concern   Not on file  Social History Narrative   Not on file   Social Determinants of Health   Financial Resource Strain: Low Risk  (07/07/2022)   Overall Financial  Resource Strain (CARDIA)    Difficulty of Paying Living Expenses: Not hard at all  Food Insecurity: No Food Insecurity (07/07/2022)   Hunger Vital Sign    Worried About Running Out of Food in the Last Year: Never true    Ran Out of Food in the Last Year: Never true  Transportation Needs: No Transportation Needs (07/07/2022)   PRAPARE - Administrator, Civil Service (Medical): No    Lack of Transportation (Non-Medical): No  Physical Activity: Sufficiently Active (07/07/2022)   Exercise Vital Sign    Days of Exercise per Week: 5 days    Minutes of Exercise per Session: 30 min  Stress: No Stress Concern Present (07/07/2022)   [employer] of Occupational Health - Occupational Stress Questionnaire    Feeling of Stress : Not at all  Social Connections: Moderately Isolated (07/07/2022)   Social Connection and Isolation Panel [NHANES]    Frequency of Communication with Friends and Family: Twice a week    Frequency of Social Gatherings with Friends and Family: More than three times a week    Attends Religious Services: More than 4 times per year    Active Member of [employer] or Organizations: No    Attends Banker Meetings: Never    Marital Status: Widowed  Intimate Partner Violence: Not At Risk (07/07/2022)   Humiliation, Afraid, Rape, and Kick questionnaire    Fear of Current or Ex-Partner: No    Emotionally Abused: No  Physically Abused: No    Sexually Abused: No    Review of Systems: See HPI, otherwise negative ROS  Physical Exam: BP (!) 170/84   Pulse 73   Temp (!) 97.2 F (36.2 C) (Temporal)   Resp 18   Ht 5\' 5"  (1.651 m)   Wt 61.2 kg   SpO2 100%   BMI 22.47 kg/m  General:   Alert,  pleasant and cooperative in NAD Head:  Normocephalic and atraumatic. Lungs:  Clear to auscultation.    Heart:  Regular rate and rhythm.   Impression/Plan: Connie Lee is here for ophthalmic surgery.  Risks, benefits, limitations, and alternatives regarding  ophthalmic surgery have been reviewed with the patient.  Questions have been answered.  All parties agreeable.   Lockie Mola, MD  05/16/2023, 9:54 AM

## 2023-05-16 NOTE — Anesthesia Postprocedure Evaluation (Signed)
Anesthesia Post Note  Patient: Connie Lee  Procedure(s) Performed: CATARACT EXTRACTION PHACO AND INTRAOCULAR LENS PLACEMENT (IOC) LEFT 9.89 00:52.3 (Left)  Patient location during evaluation: PACU Anesthesia Type: MAC Level of consciousness: awake and alert Pain management: pain level controlled Vital Signs Assessment: post-procedure vital signs reviewed and stable Respiratory status: spontaneous breathing, nonlabored ventilation, respiratory function stable and patient connected to nasal cannula oxygen Cardiovascular status: blood pressure returned to baseline and stable Postop Assessment: no apparent nausea or vomiting Anesthetic complications: no   No notable events documented.   Last Vitals:  Vitals:   05/16/23 1046 05/16/23 1053  BP: (!) 152/64 (!) 162/72  Pulse: (!) 59 63  Resp: 11 10  Temp: (!) 36.4 C   SpO2: 98% 95%    Last Pain:  Vitals:   05/16/23 1053  TempSrc:   PainSc: 0-No pain                 Donnell Beauchamp C Brayli Klingbeil

## 2023-05-16 NOTE — Transfer of Care (Signed)
Immediate Anesthesia Transfer of Care Note  Patient: Connie Lee  Procedure(s) Performed: CATARACT EXTRACTION PHACO AND INTRAOCULAR LENS PLACEMENT (IOC) LEFT 9.89 00:52.3 (Left)  Patient Location: PACU  Anesthesia Type: MAC  Level of Consciousness: awake, alert  and patient cooperative  Airway and Oxygen Therapy: Patient Spontanous Breathing and Patient connected to supplemental oxygen  Post-op Assessment: Post-op Vital signs reviewed, Patient's Cardiovascular Status Stable, Respiratory Function Stable, Patent Airway and No signs of Nausea or vomiting  Post-op Vital Signs: Reviewed and stable  Complications: No notable events documented.

## 2023-05-16 NOTE — Telephone Encounter (Signed)
  Chief Complaint: Hypertension Symptoms: BP elevated , cataract surgery today. Frequency: Today and last week, had surgery righteye Pertinent Negatives: Patient denies headache Disposition: [] ED /[] Urgent Care (no appt availability in office) / [x] Appointment(In office/virtual)/ []  [facility]/ [] Home Care/ [] Refused Recommended Disposition /[] [facility]/ []  Follow-up with PCP Additional Notes: Daughter calling, pt on line. States does not have any values, "Just told at surgeons she should get checked."  States today 171/? No BP meds,, no H/O.  Secured first available for Friday AM. Pt has F/U eye appt tomorrow, advised to write down Bp value at appt. Pt did have some dizziness Saturday and fell. States no injuries, was not evaluated.  Care advise provided, pt and daughter verbalize understanding.  Reason for Disposition  Systolic BP  >= 160 OR Diastolic >= 100  Answer Assessment - Initial Assessment Questions 1. BLOOD PRESSURE: "What is the blood pressure?" "Did you take at least two measurements 5 minutes apart?"     171/? 2. ONSET: "When did you take your blood pressure?"     Left eye surgery today 3. HOW: "How did you take your blood pressure?" (e.g., automatic home BP monitor, visiting nurse)     At eye doctors appt 4. HISTORY: "Do you have a history of high blood pressure?"     No 5. MEDICINES: "Are you taking any medicines for blood pressure?" "Have you missed any doses recently?"     None 6. OTHER SYMPTOMS: "Do you have any symptoms?" (e.g., blurred vision, chest pain, difficulty breathing, headache, weakness)     Dizzy Saturday and fell.  Protocols used: Blood Pressure - High-A-AH

## 2023-05-16 NOTE — Op Note (Signed)
OPERATIVE NOTE  Connie Lee [id number] 05/16/2023   PREOPERATIVE DIAGNOSIS:  Nuclear sclerotic cataract left eye. H25.12   POSTOPERATIVE DIAGNOSIS:    Nuclear sclerotic cataract left eye.     PROCEDURE:  Phacoemusification with posterior chamber intraocular lens placement of the left eye  Ultrasound time: Procedure(s): CATARACT EXTRACTION PHACO AND INTRAOCULAR LENS PLACEMENT (IOC) LEFT 9.89 00:52.3 (Left)  LENS:   Implant Name Type Inv. Item Serial No. Manufacturer Lot No. LRB No. Used Action  LENS IOL TECNIS EYHANCE 23.5 - [id number] Intraocular Lens LENS IOL TECNIS EYHANCE 23.5 [id number] [employer]  Left 1 Implanted      SURGEON:  Deirdre Evener, MD   ANESTHESIA:  Topical with tetracaine drops and 2% Xylocaine jelly, augmented with 1% preservative-free intracameral lidocaine.    COMPLICATIONS:  None.   DESCRIPTION OF PROCEDURE:  The patient was identified in the holding room and transported to the operating room and placed in the supine position under the operating microscope.  The left eye was identified as the operative eye and it was prepped and draped in the usual sterile ophthalmic fashion.   A 1 millimeter clear-corneal paracentesis was made at the 4:30 position.  0.5 ml of preservative-free 1% lidocaine was injected into the anterior chamber.  The anterior chamber was filled with Viscoat viscoelastic.  A 2.4 millimeter keratome was used to make a near-clear corneal incision at the 2:30 position.  .  A curvilinear capsulorrhexis was made with a cystotome and capsulorrhexis forceps.  Balanced salt solution was used to hydrodissect and hydrodelineate the nucleus.   Phacoemulsification was then used in stop and chop fashion to remove the lens nucleus and epinucleus.  The remaining cortex was then removed using the irrigation and aspiration handpiece. Provisc was then placed into the capsular bag to distend it for lens placement.  A lens was then injected into the  capsular bag.  The remaining viscoelastic was aspirated.   Wounds were hydrated with balanced salt solution.  The anterior chamber was inflated to a physiologic pressure with balanced salt solution.  No wound leaks were noted. Cefuroxime 0.1 ml of a 10mg /ml solution was injected into the anterior chamber for a dose of 1 mg of intracameral antibiotic at the completion of the case.   Timolol and Brimonidine drops were applied to the eye.  The patient was taken to the recovery room in stable condition without complications of anesthesia or surgery.  [name] 05/16/2023, 10:44 AM

## 2023-05-17 ENCOUNTER — Encounter: Payer: Self-pay | Admitting: Ophthalmology

## 2023-05-18 ENCOUNTER — Ambulatory Visit (INDEPENDENT_AMBULATORY_CARE_PROVIDER_SITE_OTHER): Payer: Medicare HMO | Admitting: Family Medicine

## 2023-05-18 ENCOUNTER — Encounter: Payer: Self-pay | Admitting: Family Medicine

## 2023-05-18 VITALS — BP 136/80 | HR 86 | Ht 65.0 in | Wt 135.0 lb

## 2023-05-18 DIAGNOSIS — R030 Elevated blood-pressure reading, without diagnosis of hypertension: Secondary | ICD-10-CM | POA: Diagnosis not present

## 2023-05-18 DIAGNOSIS — E860 Dehydration: Secondary | ICD-10-CM | POA: Diagnosis not present

## 2023-05-18 NOTE — Progress Notes (Signed)
Date:  05/18/2023   Name:  Connie Lee   DOB:  10-27-1941   MRN:  [id number]   Chief Complaint: Hypertension (Elevated bp after cataract surgery. )  Hypertension    Lab Results  Component Value Date   NA 134 (L) 10/04/2021   K 3.8 10/04/2021   CO2 23 10/04/2021   GLUCOSE 139 (H) 10/04/2021   BUN 11 10/04/2021   CREATININE 0.73 10/04/2021   CALCIUM 8.0 (L) 10/04/2021   GFRNONAA >60 10/04/2021   No results found for: "CHOL", "HDL", "LDLCALC", "LDLDIRECT", "TRIG", "CHOLHDL" No results found for: "TSH" No results found for: "HGBA1C" Lab Results  Component Value Date   WBC 9.6 10/13/2021   HGB 13.4 10/13/2021   HCT 40.7 10/13/2021   MCV 93 10/13/2021   PLT 335 10/13/2021   Lab Results  Component Value Date   ALT 15 10/03/2021   AST 19 10/03/2021   ALKPHOS 41 10/03/2021   BILITOT 1.1 10/03/2021   Lab Results  Component Value Date   VD25OH 20.67 (L) 10/05/2021     Review of Systems  Patient Active Problem List   Diagnosis Date Noted   Closed left hip fracture (HCC) 10/02/2021    No Known Allergies  Past Surgical History:  Procedure Laterality Date   ABDOMINAL HYSTERECTOMY     APPENDECTOMY     CATARACT EXTRACTION W/PHACO Right 05/09/2023   Procedure: CATARACT EXTRACTION PHACO AND INTRAOCULAR LENS PLACEMENT (IOC) RIGHT 7.87 00:38.8;  Surgeon: Lockie Mola, MD;  Location: [facility];  Service: Ophthalmology;  Laterality: Right;   CATARACT EXTRACTION W/PHACO Left 05/16/2023   Procedure: CATARACT EXTRACTION PHACO AND INTRAOCULAR LENS PLACEMENT (IOC) LEFT 9.89 00:52.3;  Surgeon: Lockie Mola, MD;  Location: [facility];  Service: Ophthalmology;  Laterality: Left;   HIP ARTHROPLASTY Left 10/03/2021   Procedure: ARTHROPLASTY BIPOLAR HIP (HEMIARTHROPLASTY);  Surgeon: Juanell Fairly, MD;  Location: [facility];  Service: Orthopedics;  Laterality: Left;    Social History   Tobacco Use   Smoking status: Never   Smokeless  tobacco: Never  Vaping Use   Vaping status: Never Used  Substance Use Topics   Alcohol use: Not Currently   Drug use: Not Currently     Medication list has been reviewed and updated.  Current Meds  Medication Sig   Multiple Vitamins-Minerals (PRESERVISION AREDS PO) Take by mouth daily.   vitamin B-12 1000 MCG tablet Take 1 tablet (1,000 mcg total) by mouth daily.       05/18/2023    9:50 AM 10/13/2021   10:54 AM 12/07/2020   10:20 AM  GAD 7 : Generalized Anxiety Score  Nervous, Anxious, on Edge 0 0 0  Control/stop worrying 0 0 0  Worry too much - different things 0 0 0  Trouble relaxing 0 0 0  Restless 0 0 0  Easily annoyed or irritable 0 0 0  Afraid - awful might happen 0 0 0  Total GAD 7 Score 0 0 0  Anxiety Difficulty Not difficult at all         05/18/2023    9:49 AM 07/07/2022    1:38 PM 07/07/2022    1:37 PM  Depression screen PHQ 2/9  Decreased Interest 0 0 0  Down, Depressed, Hopeless 0 0 0  PHQ - 2 Score 0 0 0  Altered sleeping 0    Tired, decreased energy 0    Change in appetite 0    Feeling bad or failure about yourself  0  Trouble concentrating 0    Moving slowly or fidgety/restless 0    Suicidal thoughts 0    PHQ-9 Score 0    Difficult doing work/chores Not difficult at all      BP Readings from Last 3 Encounters:  05/18/23 (!) 140/100  05/16/23 (!) 162/72  05/09/23 131/60    Physical Exam  Wt Readings from Last 3 Encounters:  05/18/23 135 lb (61.2 kg)  05/16/23 135 lb (61.2 kg)  05/09/23 137 lb (62.1 kg)    BP (!) 140/100 (BP Location: Right Arm, Cuff Size: Normal)   Pulse 64   Ht 5\' 5"  (1.651 m)   Wt 135 lb (61.2 kg)   BMI 22.47 kg/m   Assessment and Plan:  1. Dehydration See written note  2. Elevated blood pressure reading in office with white coat syndrome, without diagnosis of hypertension     Elizabeth Sauer, MD

## 2023-06-04 ENCOUNTER — Encounter: Payer: Self-pay | Admitting: Family Medicine

## 2023-06-04 ENCOUNTER — Ambulatory Visit: Payer: Medicare HMO | Admitting: Family Medicine

## 2023-06-04 VITALS — BP 128/78 | HR 73 | Ht 65.0 in | Wt 136.0 lb

## 2023-06-04 DIAGNOSIS — U071 COVID-19: Secondary | ICD-10-CM

## 2023-06-04 MED ORDER — MOLNUPIRAVIR EUA 200MG CAPSULE
4.0000 | ORAL_CAPSULE | Freq: Two times a day (BID) | ORAL | 0 refills | Status: AC
Start: 2023-06-04 — End: 2023-06-09

## 2023-06-04 MED ORDER — MOLNUPIRAVIR EUA 200MG CAPSULE: 4.0000 | ORAL_CAPSULE | Freq: Two times a day (BID) | ORAL | 0 refills | Status: DC

## 2023-06-04 NOTE — Progress Notes (Signed)
.   Date:  06/04/2023   Name:  Connie Lee   DOB:  1941-06-15   MRN:  [id number]   Chief Complaint: Covid Positive (Symptoms started yesterday with runny nose and chills- tested positive this am.)  Fever  This is a new problem. The current episode started yesterday. The problem has been unchanged. Associated symptoms include coughing. Pertinent negatives include no abdominal pain, chest pain, congestion, headaches, muscle aches, nausea or wheezing. She has tried acetaminophen for the symptoms. The treatment provided moderate relief.     Lab Results  Component Value Date   NA 134 (L) 10/04/2021   K 3.8 10/04/2021   CO2 23 10/04/2021   GLUCOSE 139 (H) 10/04/2021   BUN 11 10/04/2021   CREATININE 0.73 10/04/2021   CALCIUM 8.0 (L) 10/04/2021   GFRNONAA >60 10/04/2021   No results found for: "CHOL", "HDL", "LDLCALC", "LDLDIRECT", "TRIG", "CHOLHDL" No results found for: "TSH" No results found for: "HGBA1C" Lab Results  Component Value Date   WBC 9.6 10/13/2021   HGB 13.4 10/13/2021   HCT 40.7 10/13/2021   MCV 93 10/13/2021   PLT 335 10/13/2021   Lab Results  Component Value Date   ALT 15 10/03/2021   AST 19 10/03/2021   ALKPHOS 41 10/03/2021   BILITOT 1.1 10/03/2021   Lab Results  Component Value Date   VD25OH 20.67 (L) 10/05/2021     Review of Systems  Constitutional:  Positive for chills, diaphoresis, fatigue and fever.  HENT:  Positive for rhinorrhea. Negative for congestion.   Respiratory:  Positive for cough. Negative for shortness of breath, wheezing and stridor.   Cardiovascular:  Negative for chest pain.  Gastrointestinal:  Negative for abdominal pain and nausea.  Neurological:  Negative for headaches.    Patient Active Problem List   Diagnosis Date Noted   Closed left hip fracture (HCC) 10/02/2021    No Known Allergies  Past Surgical History:  Procedure Laterality Date   ABDOMINAL HYSTERECTOMY     APPENDECTOMY     CATARACT EXTRACTION W/PHACO  Right 05/09/2023   Procedure: CATARACT EXTRACTION PHACO AND INTRAOCULAR LENS PLACEMENT (IOC) RIGHT 7.87 00:38.8;  Surgeon: Lockie Mola, MD;  Location: [facility];  Service: Ophthalmology;  Laterality: Right;   CATARACT EXTRACTION W/PHACO Left 05/16/2023   Procedure: CATARACT EXTRACTION PHACO AND INTRAOCULAR LENS PLACEMENT (IOC) LEFT 9.89 00:52.3;  Surgeon: Lockie Mola, MD;  Location: [facility];  Service: Ophthalmology;  Laterality: Left;   HIP ARTHROPLASTY Left 10/03/2021   Procedure: ARTHROPLASTY BIPOLAR HIP (HEMIARTHROPLASTY);  Surgeon: Juanell Fairly, MD;  Location: [facility];  Service: Orthopedics;  Laterality: Left;    Social History   Tobacco Use   Smoking status: Never   Smokeless tobacco: Never  Vaping Use   Vaping status: Never Used  Substance Use Topics   Alcohol use: Not Currently   Drug use: Not Currently     Medication list has been reviewed and updated.  Current Meds  Medication Sig   Multiple Vitamins-Minerals (PRESERVISION AREDS PO) Take by mouth daily.   vitamin B-12 1000 MCG tablet Take 1 tablet (1,000 mcg total) by mouth daily.       05/18/2023    9:50 AM 10/13/2021   10:54 AM 12/07/2020   10:20 AM  GAD 7 : Generalized Anxiety Score  Nervous, Anxious, on Edge 0 0 0  Control/stop worrying 0 0 0  Worry too much - different things 0 0 0  Trouble relaxing 0 0 0  Restless 0  0 0  Easily annoyed or irritable 0 0 0  Afraid - awful might happen 0 0 0  Total GAD 7 Score 0 0 0  Anxiety Difficulty Not difficult at all         05/18/2023    9:49 AM 07/07/2022    1:38 PM 07/07/2022    1:37 PM  Depression screen PHQ 2/9  Decreased Interest 0 0 0  Down, Depressed, Hopeless 0 0 0  PHQ - 2 Score 0 0 0  Altered sleeping 0    Tired, decreased energy 0    Change in appetite 0    Feeling bad or failure about yourself  0    Trouble concentrating 0    Moving slowly or fidgety/restless 0    Suicidal thoughts 0    PHQ-9 Score 0     Difficult doing work/chores Not difficult at all      BP Readings from Last 3 Encounters:  06/04/23 128/78  05/18/23 136/80  05/16/23 (!) 162/72    Physical Exam HENT:     Right Ear: Tympanic membrane normal.     Left Ear: Tympanic membrane normal.     Nose: Nose normal.     Mouth/Throat:     Mouth: Mucous membranes are moist.  Cardiovascular:     Heart sounds: No murmur heard.    No friction rub. No gallop.  Pulmonary:     Breath sounds: No wheezing, rhonchi or rales.  Abdominal:     Tenderness: There is no abdominal tenderness.     Wt Readings from Last 3 Encounters:  06/04/23 136 lb (61.7 kg)  05/18/23 135 lb (61.2 kg)  05/16/23 135 lb (61.2 kg)    BP 128/78 (BP Location: Right Arm, Cuff Size: Large)   Pulse 73   Ht 5\' 5"  (1.651 m)   Wt 136 lb (61.7 kg)   SpO2 98%   BMI 22.63 kg/m   Assessment and Plan: Patient is a 82 year old female who presents for a fever and chills exam. The patient reports the following problems: covid. Health maintenance has been reviewed up to date.   1. COVID New onset.  Persistent but remaining unchanged in terms of symptomatology.  Stable.  Patient is well-hydrated.  In essence is stable however we will given her age proceed with medication.  Patient is a difficult blood draw and would like to go ahead and go on medication and we do not have any recent GFR so we will proceed with molnupiravir as prescribed 4 capsules twice a day for 5 days.  Patient has been told to hydrate well and return on a as needed basis. - molnupiravir EUA (LAGEVRIO) 200 mg CAPS capsule; Take 4 capsules (800 mg total) by mouth 2 (two) times daily for 5 days.  Dispense: 40 capsule; Refill: 0   Elizabeth Sauer, MD

## 2023-06-15 DIAGNOSIS — Z0100 Encounter for examination of eyes and vision without abnormal findings: Secondary | ICD-10-CM | POA: Diagnosis not present

## 2023-06-15 DIAGNOSIS — Z961 Presence of intraocular lens: Secondary | ICD-10-CM | POA: Diagnosis not present

## 2023-07-11 ENCOUNTER — Ambulatory Visit (INDEPENDENT_AMBULATORY_CARE_PROVIDER_SITE_OTHER): Payer: Medicare HMO

## 2023-07-11 DIAGNOSIS — Z0000 Encounter for general adult medical examination without abnormal findings: Secondary | ICD-10-CM

## 2023-07-11 NOTE — Progress Notes (Signed)
Subjective:   Connie Lee is a 82 y.o. female who presents for Medicare Annual (Subsequent) preventive examination.  Visit Complete: Virtual  I connected with  Connie Lee on 07/11/23 by a audio enabled telemedicine application and verified that I am speaking with the correct person using two identifiers.  Patient Location: Home  Provider Location: Office/Clinic  I discussed the limitations of evaluation and management by telemedicine. The patient expressed understanding and agreed to proceed.  Vital Signs: Unable to obtain new vitals due to this being a telehealth visit.  Review of Systems     Cardiac Risk Factors include: advanced age (>21men, >28 women)     Objective:    There were no vitals filed for this visit. There is no height or weight on file to calculate BMI.     07/11/2023    2:31 PM 05/16/2023    9:33 AM 05/09/2023   11:14 AM 10/27/2021    3:24 PM 10/02/2021    5:57 PM 06/22/2021    8:27 AM 11/15/2020    1:24 PM  Advanced Directives  Does Patient Have a Medical Advance Directive? No No No No No No No  Would patient like information on creating a medical advance directive? No - Patient declined No - Patient declined No - Patient declined  No - Patient declined Yes (MAU/Ambulatory/Procedural Areas - Information given)     Current Medications (verified) Outpatient Encounter Medications as of 07/11/2023  Medication Sig   Multiple Vitamins-Minerals (PRESERVISION AREDS PO) Take by mouth daily.   vitamin B-12 1000 MCG tablet Take 1 tablet (1,000 mcg total) by mouth daily.   No facility-administered encounter medications on file as of 07/11/2023.    Allergies (verified) Patient has no known allergies.   History: Past Medical History:  Diagnosis Date   Medical history non-contributory    Past Surgical History:  Procedure Laterality Date   ABDOMINAL HYSTERECTOMY     APPENDECTOMY     CATARACT EXTRACTION W/PHACO Right 05/09/2023   Procedure:  CATARACT EXTRACTION PHACO AND INTRAOCULAR LENS PLACEMENT (IOC) RIGHT 7.87 00:38.8;  Surgeon: Lockie Mola, MD;  Location: [facility];  Service: Ophthalmology;  Laterality: Right;   CATARACT EXTRACTION W/PHACO Left 05/16/2023   Procedure: CATARACT EXTRACTION PHACO AND INTRAOCULAR LENS PLACEMENT (IOC) LEFT 9.89 00:52.3;  Surgeon: Lockie Mola, MD;  Location: [facility];  Service: Ophthalmology;  Laterality: Left;   HIP ARTHROPLASTY Left 10/03/2021   Procedure: ARTHROPLASTY BIPOLAR HIP (HEMIARTHROPLASTY);  Surgeon: Juanell Fairly, MD;  Location: [facility];  Service: Orthopedics;  Laterality: Left;   History reviewed. No pertinent family history. Social History   Socioeconomic History   Marital status: Widowed    Spouse name: Not on file   Number of children: 3   Years of education: Not on file   Highest education level: Not on file  Occupational History   Occupation: Dispensing optician  Tobacco Use   Smoking status: Never   Smokeless tobacco: Never  Vaping Use   Vaping status: Never Used  Substance and Sexual Activity   Alcohol use: Not Currently   Drug use: Not Currently   Sexual activity: Not on file  Other Topics Concern   Not on file  Social History Narrative   Not on file   Social Determinants of Health   Financial Resource Strain: Low Risk  (07/11/2023)   Overall Financial Resource Strain (CARDIA)    Difficulty of Paying Living Expenses: Not hard at all  Food Insecurity: No Food Insecurity (07/11/2023)  Hunger Vital Sign    Worried About Running Out of Food in the Last Year: Never true    Ran Out of Food in the Last Year: Never true  Transportation Needs: No Transportation Needs (07/11/2023)   PRAPARE - Administrator, Civil Service (Medical): No    Lack of Transportation (Non-Medical): No  Physical Activity: Sufficiently Active (07/11/2023)   Exercise Vital Sign    Days of Exercise per Week: 7 days    Minutes of Exercise per Session:  40 min  Stress: No Stress Concern Present (07/11/2023)   [employer] of Occupational Health - Occupational Stress Questionnaire    Feeling of Stress : Not at all  Social Connections: Moderately Isolated (07/11/2023)   Social Connection and Isolation Panel [NHANES]    Frequency of Communication with Friends and Family: More than three times a week    Frequency of Social Gatherings with Friends and Family: Twice a week    Attends Religious Services: More than 4 times per year    Active Member of [employer] or Organizations: No    Attends Banker Meetings: Never    Marital Status: Widowed    Tobacco Counseling Counseling given: Not Answered   Clinical Intake:  Pre-visit preparation completed: Yes  Pain : No/denies pain     Nutritional Status: BMI of 19-24  Normal Nutritional Risks: None Diabetes: No  How often do you need to have someone help you when you read instructions, pamphlets, or other written materials from your doctor or pharmacy?: 1 - Never  Interpreter Needed?: No  Information entered by :: Kennedy Bucker, LPN   Activities of Daily Living    07/11/2023    2:32 PM 05/16/2023    9:28 AM  In your present state of health, do you have any difficulty performing the following activities:  Hearing? 0 0  Vision? 1 1  Difficulty concentrating or making decisions? 0 0  Walking or climbing stairs? 0   Dressing or bathing? 0 0  Doing errands, shopping? 0   Preparing Food and eating ? N   Using the Toilet? N   In the past six months, have you accidently leaked urine? N   Do you have problems with loss of bowel control? N   Managing your Medications? N   Managing your Finances? N   Housekeeping or managing your Housekeeping? N     Patient Care Team: Duanne Limerick, MD as PCP - General (Family Medicine)  Indicate any recent Medical Services you may have received from other than [facility] providers in the past year (date may be approximate).      Assessment:   This is a routine wellness examination for [place].  Hearing/Vision screen Hearing Screening - Comments:: No aids Vision Screening - Comments:: Wears glasses- [facility]- has macular degeneration   Goals Addressed             This Visit's Progress    DIET - EAT MORE FRUITS AND VEGETABLES         Depression Screen    07/11/2023    2:29 PM 05/18/2023    9:49 AM 07/07/2022    1:38 PM 07/07/2022    1:37 PM 10/13/2021   10:52 AM 06/22/2021    8:26 AM 12/07/2020   10:20 AM  PHQ 2/9 Scores  PHQ - 2 Score 0 0 0 0 0 0 0  PHQ- 9 Score 0 0   0  0    Fall Risk  07/11/2023    2:32 PM 05/18/2023    9:49 AM 07/07/2022    1:39 PM 10/13/2021   10:52 AM 06/22/2021    8:29 AM  Fall Risk   Falls in the past year? 0 1 1 1 1   Number falls in past yr: 0 0 0 1 0  Injury with Fall? 0 0 1 1 1   Risk for fall due to :  No Fall Risks Impaired balance/gait;Impaired mobility Impaired balance/gait;History of fall(s) History of fall(s)  Follow up Falls evaluation completed;Falls prevention discussed Falls evaluation completed Falls evaluation completed Falls evaluation completed Falls prevention discussed    MEDICARE RISK AT HOME: Medicare Risk at Home Any stairs in or around the home?: No If so, are there any without handrails?: No Home free of loose throw rugs in walkways, pet beds, electrical cords, etc?: Yes Adequate lighting in your home to reduce risk of falls?: Yes Life alert?: No Use of a cane, walker or w/c?: Yes (cane occasionally) Grab bars in the bathroom?: Yes Shower chair or bench in shower?: Yes Elevated toilet seat or a handicapped toilet?: Yes  TIMED UP AND GO:  Was the test performed?  No    Cognitive Function:        07/11/2023    2:33 PM 07/07/2022    1:42 PM  6CIT Screen  What Year? 0 points 0 points  What month? 0 points 0 points  What time? 0 points 0 points  Count back from 20 0 points 0 points  Months in reverse 2 points 0 points  Repeat phrase  0 points 2 points  Total Score 2 points 2 points    Immunizations  There is no immunization history on file for this patient.  TDAP status: Due, Education has been provided regarding the importance of this vaccine. Advised may receive this vaccine at local pharmacy or Health Dept. Aware to provide a copy of the vaccination record if obtained from local pharmacy or Health Dept. Verbalized acceptance and understanding.  Flu Vaccine status: Declined, Education has been provided regarding the importance of this vaccine but patient still declined. Advised may receive this vaccine at local pharmacy or Health Dept. Aware to provide a copy of the vaccination record if obtained from local pharmacy or Health Dept. Verbalized acceptance and understanding.  Pneumococcal vaccine status: Declined,  Education has been provided regarding the importance of this vaccine but patient still declined. Advised may receive this vaccine at local pharmacy or Health Dept. Aware to provide a copy of the vaccination record if obtained from local pharmacy or Health Dept. Verbalized acceptance and understanding.   Covid-19 vaccine status: Declined, Education has been provided regarding the importance of this vaccine but patient still declined. Advised may receive this vaccine at local pharmacy or Health Dept.or vaccine clinic. Aware to provide a copy of the vaccination record if obtained from local pharmacy or Health Dept. Verbalized acceptance and understanding.  Qualifies for Shingles Vaccine? Yes   Zostavax completed No   Shingrix Completed?: No.    Education has been provided regarding the importance of this vaccine. Patient has been advised to call insurance company to determine out of pocket expense if they have not yet received this vaccine. Advised may also receive vaccine at local pharmacy or Health Dept. Verbalized acceptance and understanding.  Screening Tests Health Maintenance  Topic Date Due   DTaP/Tdap/Td (1  - Tdap) Never done   Zoster Vaccines- Shingrix (1 of 2) Never done   Pneumonia Vaccine 65+  Years old (1 of 1 - PCV) Never done   DEXA SCAN  Never done   INFLUENZA VACCINE  Never done   COVID-19 Vaccine (1 - 2023-24 season) Never done   Medicare Annual Wellness (AWV)  07/10/2024   HPV VACCINES  Aged Out    Health Maintenance  Health Maintenance Due  Topic Date Due   DTaP/Tdap/Td (1 - Tdap) Never done   Zoster Vaccines- Shingrix (1 of 2) Never done   Pneumonia Vaccine 60+ Years old (1 of 1 - PCV) Never done   DEXA SCAN  Never done   INFLUENZA VACCINE  Never done   COVID-19 Vaccine (1 - 2023-24 season) Never done    Colorectal cancer screening: No longer required.   Mammogram status: No longer required due to age.  Declined [facility] referral  Lung Cancer Screening: (Low Dose CT Chest recommended if Age 42-80 years, 20 pack-year currently smoking OR have quit w/in 15years.) does not qualify.    Additional Screening:  Hepatitis C Screening: does not qualify; Completed no  Vision Screening: Recommended annual ophthalmology exams for early detection of glaucoma and other disorders of the eye. Is the patient up to date with their annual eye exam?  Yes  Who is the provider or what is the name of the office in which the patient attends annual eye exams? [facility] If pt is not established with a provider, would they like to be referred to a provider to establish care? No .   Dental Screening: Recommended annual dental exams for proper oral hygiene    Community Resource Referral / Chronic Care Management: CRR required this visit?  No   CCM required this visit?  No     Plan:     I have personally reviewed and noted the following in the patient's chart:   Medical and social history Use of alcohol, tobacco or illicit drugs  Current medications and supplements including opioid prescriptions. Patient is not currently taking opioid prescriptions. Functional ability and  status Nutritional status Physical activity Advanced directives List of other physicians Hospitalizations, surgeries, and ER visits in previous 12 months Vitals Screenings to include cognitive, depression, and falls Referrals and appointments  In addition, I have reviewed and discussed with patient certain preventive protocols, quality metrics, and best practice recommendations. A written personalized care plan for preventive services as well as general preventive health recommendations were provided to patient.     Hal Hope, LPN   [id number]/61/[id number]   After Visit Summary: (MyChart) Due to this being a telephonic visit, the after visit summary with patients personalized plan was offered to patient via MyChart   Nurse Notes: none

## 2023-07-11 NOTE — Patient Instructions (Addendum)
Ms. Connie Lee , Thank you for taking time to come for your Medicare Wellness Visit. I appreciate your ongoing commitment to your health goals. Please review the following plan we discussed and let me know if I can assist you in the future.   Referrals/Orders/Follow-Ups/Clinician Recommendations: none  This is a list of the screening recommended for you and due dates:  Health Maintenance  Topic Date Due   DTaP/Tdap/Td vaccine (1 - Tdap) Never done   Zoster (Shingles) Vaccine (1 of 2) Never done   Pneumonia Vaccine (1 of 1 - PCV) Never done   DEXA scan (bone density measurement)  Never done   Flu Shot  Never done   COVID-19 Vaccine (1 - 2023-24 season) Never done   Medicare Annual Wellness Visit  07/10/2024   HPV Vaccine  Aged Out    Advanced directives: (ACP Link)Information on Advanced Care Planning can be found at [facility] Advance Health Care Directives Advance Health Care Directives ([email or web address]   Next Medicare Annual Wellness Visit scheduled for next year: Yes   07/16/24 @ 10:15 am by phone

## 2023-12-18 DIAGNOSIS — H04123 Dry eye syndrome of bilateral lacrimal glands: Secondary | ICD-10-CM | POA: Diagnosis not present

## 2023-12-18 DIAGNOSIS — H35372 Puckering of macula, left eye: Secondary | ICD-10-CM | POA: Diagnosis not present

## 2023-12-18 DIAGNOSIS — Z0100 Encounter for examination of eyes and vision without abnormal findings: Secondary | ICD-10-CM | POA: Diagnosis not present

## 2023-12-18 DIAGNOSIS — Z961 Presence of intraocular lens: Secondary | ICD-10-CM | POA: Diagnosis not present

## 2023-12-18 DIAGNOSIS — H353132 Nonexudative age-related macular degeneration, bilateral, intermediate dry stage: Secondary | ICD-10-CM | POA: Diagnosis not present

## 2024-07-16 ENCOUNTER — Ambulatory Visit: Payer: Medicare HMO | Admitting: Emergency Medicine

## 2024-07-16 VITALS — Ht 64.0 in | Wt 140.0 lb

## 2024-07-16 DIAGNOSIS — Z0000 Encounter for general adult medical examination without abnormal findings: Secondary | ICD-10-CM

## 2024-07-16 NOTE — Patient Instructions (Signed)
 Connie Lee,  Thank you for taking the time for your Medicare Wellness Visit. I appreciate your continued commitment to your health goals. Please review the care plan we discussed, and feel free to reach out if I can assist you further.  Medicare recommends these wellness visits once per year to help you and your care team stay ahead of potential health issues. These visits are designed to focus on prevention, allowing your provider to concentrate on managing your acute and chronic conditions during your regular appointments.  Please note that Annual Wellness Visits do not include a physical exam. Some assessments may be limited, especially if the visit was conducted virtually. If needed, we may recommend a separate in-person follow-up with your provider.  Ongoing Care Seeing your primary care provider every 3 to 6 months helps us  monitor your health and provide consistent, personalized care.   Referrals If a referral was made during today's visit and you haven't received any updates within two weeks, please contact the referred provider directly to check on the status.  Recommended Screenings: None  Health Maintenance  Topic Date Due   DTaP/Tdap/Td vaccine (1 - Tdap) Never done   Pneumococcal Vaccine for age over 73 (1 of 1 - PCV) Never done   Zoster (Shingles) Vaccine (1 of 2) Never done   DEXA scan (bone density measurement)  Never done   COVID-19 Vaccine (1 - 2024-25 season) Never done   Flu Shot  01/27/2025*   Medicare Annual Wellness Visit  07/16/2025   HPV Vaccine  Aged Out   Meningitis B Vaccine  Aged Out  *Topic was postponed. The date shown is not the original due date.       07/16/2024   10:53 AM  Advanced Directives  Does Patient Have a Medical Advance Directive? No  Would patient like information on creating a medical advance directive? Yes (MAU/Ambulatory/Procedural Areas - Information given)   Advance Care Planning is important because it: Ensures you receive  medical care that aligns with your values, goals, and preferences. Provides guidance to your family and loved ones, reducing the emotional burden of decision-making during critical moments.  Vision: Annual vision screenings are recommended for early detection of glaucoma, cataracts, and diabetic retinopathy. These exams can also reveal signs of chronic conditions such as diabetes and high blood pressure.  Dental: Annual dental screenings help detect early signs of oral cancer, gum disease, and other conditions linked to overall health, including heart disease and diabetes.  Please see the attached documents for additional preventive care recommendations.   Fall Prevention in the Home, Adult Falls can cause injuries and affect people of all ages. There are many simple things that you can do to make your home safe and to help prevent falls. If you need it, ask for help making these changes. What actions can I take to prevent falls? General information Use good lighting in all rooms. Make sure to: Replace any light bulbs that burn out. Turn on lights if it is dark and use night-lights. Keep items that you use often in easy-to-reach places. Lower the shelves around your home if needed. Move furniture so that there are clear paths around it. Do not keep throw rugs or other things on the floor that can make you trip. If any of your floors are uneven, fix them. Add color or contrast paint or tape to clearly mark and help you see: Grab bars or handrails. First and last steps of staircases. Where the edge of each step  is. If you use a ladder or stepladder: Make sure that it is fully opened. Do not climb a closed ladder. Make sure the sides of the ladder are locked in place. Have someone hold the ladder while you use it. Know where your pets are as you move through your home. What can I do in the bathroom?     Keep the floor dry. Clean up any water that is on the floor right away. Remove  soap buildup in the bathtub or shower. Buildup makes bathtubs and showers slippery. Use non-skid mats or decals on the floor of the bathtub or shower. Attach bath mats securely with double-sided, non-slip rug tape. If you need to sit down while you are in the shower, use a non-slip stool. Install grab bars by the toilet and in the bathtub and shower. Do not use towel bars as grab bars. What can I do in the bedroom? Make sure that you have a light by your bed that is easy to reach. Do not use any sheets or blankets on your bed that hang to the floor. Have a firm bench or chair with side arms that you can use for support when you get dressed. What can I do in the kitchen? Clean up any spills right away. If you need to reach something above you, use a sturdy step stool that has a grab bar. Keep electrical cables out of the way. Do not use floor polish or wax that makes floors slippery. What can I do with my stairs? Do not leave anything on the stairs. Make sure that you have a light switch at the top and the bottom of the stairs. Have them installed if you do not have them. Make sure that there are handrails on both sides of the stairs. Fix handrails that are broken or loose. Make sure that handrails are as long as the staircases. Install non-slip stair treads on all stairs in your home if they do not have carpet. Avoid having throw rugs at the top or bottom of stairs, or secure the rugs with carpet tape to prevent them from moving. Choose a carpet design that does not hide the edge of steps on the stairs. Make sure that carpet is firmly attached to the stairs. Fix any carpet that is loose or worn. What can I do on the outside of my home? Use bright outdoor lighting. Repair the edges of walkways and driveways and fix any cracks. Clear paths of anything that can make you trip, such as tools or rocks. Add color or contrast paint or tape to clearly mark and help you see high doorway  thresholds. Trim any bushes or trees on the main path into your home. Check that handrails are securely fastened and in good repair. Both sides of all steps should have handrails. Install guardrails along the edges of any raised decks or porches. Have leaves, snow, and ice cleared regularly. Use sand, salt, or ice melt on walkways during winter months if you live where there is ice and snow. In the garage, clean up any spills right away, including grease or oil spills. What other actions can I take? Review your medicines with your health care provider. Some medicines can make you confused or feel dizzy. This can increase your chance of falling. Wear closed-toe shoes that fit well and support your feet. Wear shoes that have rubber soles and low heels. Use a cane, walker, scooter, or crutches that help you move around if needed. Talk  with your provider about other ways that you can decrease your risk of falls. This may include seeing a physical therapist to learn to do exercises to improve movement and strength. Where to find more information Centers for Disease Control and Prevention, STEADI: TonerPromos.no [employer] on Aging: BaseRingTones.pl National Institute on Aging: BaseRingTones.pl Contact a health care provider if: You are afraid of falling at home. You feel weak, drowsy, or dizzy at home. You fall at home. Get help right away if you: Lose consciousness or have trouble moving after a fall. Have a fall that causes a head injury. These symptoms may be an emergency. Get help right away. Call 911. Do not wait to see if the symptoms will go away. Do not drive yourself to the hospital. This information is not intended to replace advice given to you by your health care provider. Make sure you discuss any questions you have with your health care provider. Document Revised: 06/19/2022 Document Reviewed: 06/19/2022 Elsevier Patient Education  2024 [employer].

## 2024-07-16 NOTE — Progress Notes (Signed)
 Subjective:   Connie Lee is a 83 y.o. who presents for a Medicare Wellness preventive visit.  As a reminder, Annual Wellness Visits don't include a physical exam, and some assessments may be limited, especially if this visit is performed virtually. We may recommend an in-person follow-up visit with your provider if needed.  Visit Complete: Virtual I connected with  Connie Lee on 07/16/24 by a audio enabled telemedicine application and verified that I am speaking with the correct person using two identifiers.  Patient Location: Home  Provider Location: Home Office  I discussed the limitations of evaluation and management by telemedicine. The patient expressed understanding and agreed to proceed.  Vital Signs: Because this visit was a virtual/telehealth visit, some criteria may be missing or patient reported. Any vitals not documented were not able to be obtained and vitals that have been documented are patient reported.  VideoDeclined- This patient declined Librarian, academic. Therefore the visit was completed with audio only.  Persons Participating in Visit: Patient.  AWV Questionnaire: No: Patient Medicare AWV questionnaire was not completed prior to this visit.  Cardiac Risk Factors include: advanced age (>38men, >77 women)     Objective:    Today's Vitals   07/16/24 1040  Weight: 140 lb (63.5 kg)  Height: 5' 4 (1.626 m)   Body mass index is 24.03 kg/m.     07/16/2024   10:53 AM 07/11/2023    2:31 PM 05/16/2023    9:33 AM 05/09/2023   11:14 AM 10/27/2021    3:24 PM 10/02/2021    5:57 PM 06/22/2021    8:27 AM  Advanced Directives  Does Patient Have a Medical Advance Directive? No No No No No No No  Would patient like information on creating a medical advance directive? Yes (MAU/Ambulatory/Procedural Areas - Information given) No - Patient declined No - Patient declined No - Patient declined  No - Patient declined Yes  (MAU/Ambulatory/Procedural Areas - Information given)    Current Medications (verified) Outpatient Encounter Medications as of 07/16/2024  Medication Sig   Multiple Minerals-Vitamins (CITRACAL MAXIMUM PLUS PO) Take 1 tablet by mouth daily.   Multiple Vitamins-Minerals (PRESERVISION AREDS PO) Take by mouth daily.   vitamin B-12 1000 MCG tablet Take 1 tablet (1,000 mcg total) by mouth daily.   No facility-administered encounter medications on file as of 07/16/2024.    Allergies (verified) Patient has no known allergies.   History: Past Medical History:  Diagnosis Date   Medical history non-contributory    Past Surgical History:  Procedure Laterality Date   ABDOMINAL HYSTERECTOMY     APPENDECTOMY     CATARACT EXTRACTION W/PHACO Right 05/09/2023   Procedure: CATARACT EXTRACTION PHACO AND INTRAOCULAR LENS PLACEMENT (IOC) RIGHT 7.87 00:38.8;  Surgeon: Mittie Gaskin, MD;  Location: [facility];  Service: Ophthalmology;  Laterality: Right;   CATARACT EXTRACTION W/PHACO Left 05/16/2023   Procedure: CATARACT EXTRACTION PHACO AND INTRAOCULAR LENS PLACEMENT (IOC) LEFT 9.89 00:52.3;  Surgeon: Mittie Gaskin, MD;  Location: [facility];  Service: Ophthalmology;  Laterality: Left;   HIP ARTHROPLASTY Left 10/03/2021   Procedure: ARTHROPLASTY BIPOLAR HIP (HEMIARTHROPLASTY);  Surgeon: Marchia Drivers, MD;  Location: [facility];  Service: Orthopedics;  Laterality: Left;   Family History  Problem Relation Age of Onset   Parkinson's disease Mother    Diabetes Father    Lung cancer Father    Social History   Socioeconomic History   Marital status: Widowed    Spouse name: Not on  file   Number of children: 3   Years of education: Not on file   Highest education level: Not on file  Occupational History   Occupation: Babysitter  Tobacco Use   Smoking status: Never   Smokeless tobacco: Never  Vaping Use   Vaping status: Never Used  Substance and Sexual Activity    Alcohol use: Not Currently   Drug use: Not Currently   Sexual activity: Not on file  Other Topics Concern   Not on file  Social History Narrative   07/16/24 Has custody of 3 other children from social services/pbt   Social Drivers of Health   Financial Resource Strain: Low Risk  (07/16/2024)   Overall Financial Resource Strain (CARDIA)    Difficulty of Paying Living Expenses: Not hard at all  Food Insecurity: No Food Insecurity (07/16/2024)   Hunger Vital Sign    Worried About Running Out of Food in the Last Year: Never true    Ran Out of Food in the Last Year: Never true  Transportation Needs: No Transportation Needs (07/16/2024)   PRAPARE - Administrator, Civil Service (Medical): No    Lack of Transportation (Non-Medical): No  Physical Activity: Inactive (07/16/2024)   Exercise Vital Sign    Days of Exercise per Week: 0 days    Minutes of Exercise per Session: 0 min  Stress: No Stress Concern Present (07/16/2024)   [employer] of Occupational Health - Occupational Stress Questionnaire    Feeling of Stress: Not at all  Social Connections: Socially Isolated (07/16/2024)   Social Connection and Isolation Panel    Frequency of Communication with Friends and Family: More than three times a week    Frequency of Social Gatherings with Friends and Family: More than three times a week    Attends Religious Services: Never    Database administrator or Organizations: No    Attends Banker Meetings: Never    Marital Status: Widowed    Tobacco Counseling Counseling given: Not Answered    Clinical Intake:  Pre-visit preparation completed: Yes  Pain : No/denies pain     BMI - recorded: 24.03 Nutritional Status: BMI of 19-24  Normal Nutritional Risks: None Diabetes: No  No results found for: HGBA1C   How often do you need to have someone help you when you read instructions, pamphlets, or other written materials from your doctor or pharmacy?: 1 -  Never  Interpreter Needed?: No  Information entered by :: Vina Ned, CMA   Activities of Daily Living     07/16/2024   10:42 AM  In your present state of health, do you have any difficulty performing the following activities:  Hearing? 0  Vision? 0  Difficulty concentrating or making decisions? 0  Walking or climbing stairs? 1  Comment uses cane prn if outside with animals  Dressing or bathing? 0  Doing errands, shopping? 1  Comment daughters take to appointments  Preparing Food and eating ? N  Using the Toilet? N  In the past six months, have you accidently leaked urine? N  Do you have problems with loss of bowel control? N  Managing your Medications? N  Managing your Finances? N  Housekeeping or managing your Housekeeping? N    Patient Care Team: Lemon Raisin, MD as PCP - General (Internal Medicine) Mittie Gaskin, MD as Referring Physician (Ophthalmology)  I have updated your Care Teams any recent Medical Services you may have received from other providers in the  past year.     Assessment:   This is a routine wellness examination for [place].  Hearing/Vision screen Hearing Screening - Comments:: Denies hearing loss  Vision Screening - Comments:: Gets routine eye exams, Dr. Mittie @ [facility] [place]   Goals Addressed             This Visit's Progress    Patient Stated       Maintain current health and activities       Depression Screen     07/16/2024   10:52 AM 07/11/2023    2:29 PM 05/18/2023    9:49 AM 07/07/2022    1:38 PM 07/07/2022    1:37 PM 10/13/2021   10:52 AM 06/22/2021    8:26 AM  PHQ 2/9 Scores  PHQ - 2 Score 0 0 0 0 0 0 0  PHQ- 9 Score 0 0 0   0     Fall Risk     07/16/2024   10:55 AM 07/11/2023    2:32 PM 05/18/2023    9:49 AM 07/07/2022    1:39 PM 10/13/2021   10:52 AM  Fall Risk   Falls in the past year? 0 0 1 1 1   Number falls in past yr: 0 0 0 0 1  Injury with Fall? 0 0 0 1 1  Risk for fall due to :  History of fall(s);Impaired balance/gait;Orthopedic patient  No Fall Risks Impaired balance/gait;Impaired mobility Impaired balance/gait;History of fall(s)  Follow up Falls evaluation completed;Education provided Falls evaluation completed;Falls prevention discussed Falls evaluation completed Falls evaluation completed  Falls evaluation completed      Data saved with a previous flowsheet row definition    MEDICARE RISK AT HOME:  Medicare Risk at Home Any stairs in or around the home?: Yes If so, are there any without handrails?: No Home free of loose throw rugs in walkways, pet beds, electrical cords, etc?: Yes Adequate lighting in your home to reduce risk of falls?: Yes Life alert?: Yes Use of a cane, walker or w/c?: Yes (cane prn outside) Grab bars in the bathroom?: Yes Shower chair or bench in shower?: Yes Elevated toilet seat or a handicapped toilet?: Yes  TIMED UP AND GO:  Was the test performed?  No  Cognitive Function: 6CIT completed        07/16/2024   10:57 AM 07/11/2023    2:33 PM 07/07/2022    1:42 PM  6CIT Screen  What Year? 0 points 0 points 0 points  What month? 0 points 0 points 0 points  What time? 0 points 0 points 0 points  Count back from 20 0 points 0 points 0 points  Months in reverse 2 points 2 points 0 points  Repeat phrase 0 points 0 points 2 points  Total Score 2 points 2 points 2 points    Immunizations  There is no immunization history on file for this patient.  Screening Tests Health Maintenance  Topic Date Due   DTaP/Tdap/Td (1 - Tdap) Never done   Pneumococcal Vaccine: 50+ Years (1 of 1 - PCV) Never done   Zoster Vaccines- Shingrix (1 of 2) Never done   DEXA SCAN  Never done   COVID-19 Vaccine (1 - 2024-25 season) Never done   Influenza Vaccine  01/27/2025 (Originally 05/30/2024)   Medicare Annual Wellness (AWV)  07/16/2025   HPV VACCINES  Aged Out   Meningococcal B Vaccine  Aged Out    Health Maintenance Items Addressed: See Nurse  Notes  at the end of this note  Additional Screening:  Vision Screening: Recommended annual ophthalmology exams for early detection of glaucoma and other disorders of the eye. Is the patient up to date with their annual eye exam?  Yes  Who is the provider or what is the name of the office in which the patient attends annual eye exams? Dr. Mittie @ [facility]  Dental Screening: Recommended annual dental exams for proper oral hygiene  Community Resource Referral / Chronic Care Management: CRR required this visit?  No   CCM required this visit?  No   Plan:    I have personally reviewed and noted the following in the patient's chart:   Medical and social history Use of alcohol, tobacco or illicit drugs  Current medications and supplements including opioid prescriptions. Patient is not currently taking opioid prescriptions. Functional ability and status Nutritional status Physical activity Advanced directives List of other physicians Hospitalizations, surgeries, and ER visits in previous 12 months Vitals Screenings to include cognitive, depression, and falls Referrals and appointments  In addition, I have reviewed and discussed with patient certain preventive protocols, quality metrics, and best practice recommendations. A written personalized care plan for preventive services as well as general preventive health recommendations were provided to patient.   Vina Ned, CMA   07/16/2024   After Visit Summary: (Mail) Due to this being a telephonic visit, the after visit summary with patients personalized plan was offered to patient via mail   Notes:  6 CIT score - 2 Scheduled transfer of care OV for 08/21/24 (prev [facility] pt) Declined flu, pneumonia, shingles and covid vaccines Declined DEXA scan Screening colonoscopy and MMG no longer recommended due to age

## 2024-08-21 ENCOUNTER — Ambulatory Visit (INDEPENDENT_AMBULATORY_CARE_PROVIDER_SITE_OTHER): Admitting: Student

## 2024-08-21 ENCOUNTER — Encounter: Payer: Self-pay | Admitting: Student

## 2024-08-21 VITALS — BP 144/80 | HR 68 | Ht 64.0 in | Wt 134.0 lb

## 2024-08-21 DIAGNOSIS — Z13220 Encounter for screening for lipoid disorders: Secondary | ICD-10-CM | POA: Diagnosis not present

## 2024-08-21 DIAGNOSIS — Z8781 Personal history of (healed) traumatic fracture: Secondary | ICD-10-CM

## 2024-08-21 DIAGNOSIS — E559 Vitamin D deficiency, unspecified: Secondary | ICD-10-CM

## 2024-08-21 DIAGNOSIS — Z136 Encounter for screening for cardiovascular disorders: Secondary | ICD-10-CM

## 2024-08-21 DIAGNOSIS — H6121 Impacted cerumen, right ear: Secondary | ICD-10-CM

## 2024-08-21 DIAGNOSIS — R030 Elevated blood-pressure reading, without diagnosis of hypertension: Secondary | ICD-10-CM

## 2024-08-21 DIAGNOSIS — S72002A Fracture of unspecified part of neck of left femur, initial encounter for closed fracture: Secondary | ICD-10-CM

## 2024-08-21 DIAGNOSIS — D508 Other iron deficiency anemias: Secondary | ICD-10-CM | POA: Diagnosis not present

## 2024-08-21 NOTE — Progress Notes (Unsigned)
   Established Patient Office Visit  Subjective   Patient ID: Connie Lee, female    DOB: October 28, 1941  Age: 83 y.o. MRN: [id number]  Chief Complaint  Patient presents with   Establish Care    Connie Lee 83 y.o. with medical hx listed below presents today for transfer for care.  Uses cane when walking alone worried about going down step   {History (Optional):23778}  ROS Refer to HPI    Objective:     Outpatient Encounter Medications as of 08/21/2024  Medication Sig   Multiple Minerals-Vitamins (CITRACAL MAXIMUM PLUS PO) Take 1 tablet by mouth daily.   Multiple Vitamins-Minerals (PRESERVISION AREDS PO) Take by mouth daily. (Patient not taking: Reported on 08/21/2024)   vitamin B-12 1000 MCG tablet Take 1 tablet (1,000 mcg total) by mouth daily. (Patient not taking: Reported on 08/21/2024)   No facility-administered encounter medications on file as of 08/21/2024.    BP (!) 140/80   Pulse 68   Ht 5' 4 (1.626 m)   Wt 134 lb (60.8 kg)   SpO2 95%   BMI 23.00 kg/m  BP Readings from Last 3 Encounters:  08/21/24 (!) 140/80  06/04/23 128/78  05/18/23 136/80    Physical Exam     08/21/2024    8:43 AM 07/16/2024   10:52 AM 07/11/2023    2:29 PM  Depression screen PHQ 2/9  Decreased Interest 0 0 0  Down, Depressed, Hopeless 0 0 0  PHQ - 2 Score 0 0 0  Altered sleeping  0 0  Tired, decreased energy  0 0  Change in appetite  0 0  Feeling bad or failure about yourself   0 0  Trouble concentrating  0 0  Moving slowly or fidgety/restless  0 0  Suicidal thoughts  0 0  PHQ-9 Score  0 0  Difficult doing work/chores  Not difficult at all Not difficult at all       08/21/2024    8:43 AM 05/18/2023    9:50 AM 10/13/2021   10:54 AM 12/07/2020   10:20 AM  GAD 7 : Generalized Anxiety Score  Nervous, Anxious, on Edge 0 0 0 0  Control/stop worrying 0 0 0 0  Worry too much - different things  0 0 0  Trouble relaxing  0 0 0  Restless  0 0 0  Easily annoyed or  irritable  0 0 0  Afraid - awful might happen  0 0 0  Total GAD 7 Score  0 0 0  Anxiety Difficulty  Not difficult at all      No results found for any visits on 08/21/24.  {Labs (Optional):23779}  The ASCVD Risk score (Arnett DK, et al., 2019) failed to calculate for the following reasons:   The 2019 ASCVD risk score is only valid for ages 56 to 41    Assessment & Plan:  There are no diagnoses linked to this encounter.   No follow-ups on file.    Harlene Saddler, MD

## 2024-08-22 ENCOUNTER — Ambulatory Visit: Payer: Self-pay | Admitting: Student

## 2024-08-22 DIAGNOSIS — R030 Elevated blood-pressure reading, without diagnosis of hypertension: Secondary | ICD-10-CM | POA: Insufficient documentation

## 2024-08-22 DIAGNOSIS — D649 Anemia, unspecified: Secondary | ICD-10-CM | POA: Insufficient documentation

## 2024-08-22 DIAGNOSIS — Z13220 Encounter for screening for lipoid disorders: Secondary | ICD-10-CM | POA: Insufficient documentation

## 2024-08-22 DIAGNOSIS — E559 Vitamin D deficiency, unspecified: Secondary | ICD-10-CM

## 2024-08-22 DIAGNOSIS — H6121 Impacted cerumen, right ear: Secondary | ICD-10-CM | POA: Insufficient documentation

## 2024-08-22 LAB — CBC WITH DIFFERENTIAL/PLATELET
Basophils Absolute: 0.1 x10E3/uL (ref 0.0–0.2)
Basos: 1 %
EOS (ABSOLUTE): 0.2 x10E3/uL (ref 0.0–0.4)
Eos: 3 %
Hematocrit: 44.9 % (ref 34.0–46.6)
Hemoglobin: 14.5 g/dL (ref 11.1–15.9)
Immature Grans (Abs): 0 x10E3/uL (ref 0.0–0.1)
Immature Granulocytes: 0 %
Lymphocytes Absolute: 2.5 x10E3/uL (ref 0.7–3.1)
Lymphs: 37 %
MCH: 30.8 pg (ref 26.6–33.0)
MCHC: 32.3 g/dL (ref 31.5–35.7)
MCV: 95 fL (ref 79–97)
Monocytes Absolute: 0.6 x10E3/uL (ref 0.1–0.9)
Monocytes: 9 %
Neutrophils Absolute: 3.5 x10E3/uL (ref 1.4–7.0)
Neutrophils: 50 %
Platelets: 161 x10E3/uL (ref 150–450)
RBC: 4.71 x10E6/uL (ref 3.77–5.28)
RDW: 12.5 % (ref 11.7–15.4)
WBC: 6.9 x10E3/uL (ref 3.4–10.8)

## 2024-08-22 LAB — VITAMIN D 25 HYDROXY (VIT D DEFICIENCY, FRACTURES): Vit D, 25-Hydroxy: 17.5 ng/mL — ABNORMAL LOW (ref 30.0–100.0)

## 2024-08-22 LAB — COMPREHENSIVE METABOLIC PANEL WITH GFR
ALT: 15 IU/L (ref 0–32)
AST: 25 IU/L (ref 0–40)
Albumin: 4.8 g/dL — ABNORMAL HIGH (ref 3.7–4.7)
Alkaline Phosphatase: 59 IU/L (ref 48–129)
BUN/Creatinine Ratio: 20 (ref 12–28)
BUN: 21 mg/dL (ref 8–27)
Bilirubin Total: 0.5 mg/dL (ref 0.0–1.2)
CO2: 23 mmol/L (ref 20–29)
Calcium: 9.4 mg/dL (ref 8.7–10.3)
Chloride: 101 mmol/L (ref 96–106)
Creatinine, Ser: 1.03 mg/dL — ABNORMAL HIGH (ref 0.57–1.00)
Globulin, Total: 2.7 g/dL (ref 1.5–4.5)
Glucose: 93 mg/dL (ref 70–99)
Potassium: 4.1 mmol/L (ref 3.5–5.2)
Sodium: 140 mmol/L (ref 134–144)
Total Protein: 7.5 g/dL (ref 6.0–8.5)
eGFR: 54 mL/min/1.73 — ABNORMAL LOW (ref >59)

## 2024-08-22 LAB — LIPID PANEL
Chol/HDL Ratio: 3.2 ratio (ref 0.0–4.4)
Cholesterol, Total: 216 mg/dL — ABNORMAL HIGH (ref 100–199)
HDL: 68 mg/dL (ref >39)
LDL Chol Calc (NIH): 132 mg/dL — ABNORMAL HIGH (ref 0–99)
Triglycerides: 88 mg/dL (ref 0–149)
VLDL Cholesterol Cal: 16 mg/dL (ref 5–40)

## 2024-08-22 NOTE — Assessment & Plan Note (Signed)
 Vitamin D  on 10/05/2021 low at 20, repeat today. She has been taking calcium  and vitamin D  supplements.

## 2024-08-22 NOTE — Assessment & Plan Note (Signed)
 Improved with lavage of ear. Normal TM. Does report decreased hearing, Declined audiology evaluation.

## 2024-08-22 NOTE — Assessment & Plan Note (Addendum)
 Fall from ground height in 2022 s/p surgical management. Concerning for fragility fracture. Discussed risks and benefits of BMD or bisphosphate therapy. Patient declines as she is feeling well. Does take citracal 1 tablet daily. Check vitamin D  and calcium  today. Continue supplementation and weight bearing exercise.

## 2024-08-22 NOTE — Assessment & Plan Note (Signed)
 BP elevated today, generally normotensive to low BP on chart review. Will have her monitor BP and keep log. Continue monitoring pressures. Daughter will contact office if consistently >140/90.

## 2024-08-27 ENCOUNTER — Telehealth: Payer: Self-pay

## 2024-08-27 NOTE — Telephone Encounter (Signed)
 Copied from CRM [id number]. Topic: Clinical - Lab/Test Results >> Aug 27, 2024  9:36 AM Emylou G wrote: Reason for CRM: adv patient of her labs.. says she will call back when she discusses with her children about the f/u's

## 2024-10-02 ENCOUNTER — Other Ambulatory Visit: Payer: Self-pay

## 2024-10-02 DIAGNOSIS — R030 Elevated blood-pressure reading, without diagnosis of hypertension: Secondary | ICD-10-CM

## 2024-10-02 NOTE — Telephone Encounter (Signed)
 Called patient to reminder her of the repeat BMP per Dr. Lemon. Order placed. Patient verbalized understanding.

## 2024-10-17 ENCOUNTER — Telehealth: Payer: Self-pay

## 2024-10-17 NOTE — Telephone Encounter (Unsigned)
 Copied from CRM #[id number]. Topic: Clinical - Medication Question >> Oct 17, 2024  4:34 PM Delon HERO wrote: Reason for CRM: Patient's daughter is calling to report that she tested postive today for flu. Patient is not having any symptoms. However, Shona would like to know if Tamaflu should be called in for the patient. Please advise

## 2024-10-20 NOTE — Telephone Encounter (Signed)
 Called daughter Shona who rested positive for influenza about 4 days ago. Patient is not having any symptoms and feeling well. Daughter lives with the patient and has been trying to limit contact with patient. Given length of exposure without symptoms will hold off on Tamiflu, advised she call back if patient develops symptoms or tests positive for flu

## 2024-10-20 NOTE — Telephone Encounter (Signed)
 Noted

## 2024-10-20 NOTE — Telephone Encounter (Signed)
 Please review

## 2024-12-04 ENCOUNTER — Encounter: Payer: Self-pay | Admitting: Student

## 2024-12-04 ENCOUNTER — Ambulatory Visit: Admitting: Student

## 2024-12-04 VITALS — BP 132/82 | HR 88 | Ht 64.0 in | Wt 134.0 lb

## 2024-12-04 DIAGNOSIS — E559 Vitamin D deficiency, unspecified: Secondary | ICD-10-CM

## 2024-12-04 DIAGNOSIS — N3941 Urge incontinence: Secondary | ICD-10-CM | POA: Insufficient documentation

## 2024-12-04 DIAGNOSIS — R7989 Other specified abnormal findings of blood chemistry: Secondary | ICD-10-CM

## 2024-12-04 DIAGNOSIS — R748 Abnormal levels of other serum enzymes: Secondary | ICD-10-CM | POA: Insufficient documentation

## 2024-12-04 MED ORDER — MIRABEGRON ER 25 MG PO TB24: 25.0000 mg | ORAL_TABLET | Freq: Every day | ORAL | 3 refills | Status: AC

## 2024-12-04 NOTE — Patient Instructions (Addendum)
 We will repeat you kidney function labs today and vitamin D  level.  Please work on drinking more water.   Start mirabegron  25 mg daily for overactive bladder and work on kegel exercises to help with incontinence

## 2024-12-04 NOTE — Assessment & Plan Note (Signed)
 Advised she start home kegel exercises, she has done this sporadically in the past. Will trial mirabegron  25 mg daily. Would like to avoid antimuscarinic given age and hx of fragility fracture.

## 2024-12-04 NOTE — Assessment & Plan Note (Signed)
Repeat vitamin D today.  

## 2024-12-04 NOTE — Assessment & Plan Note (Signed)
 Likely due to decrease water intake due to urgency incontinence. Has been trying to drink more water lately. Rare NSAID use <2x per month. RFP today

## 2024-12-04 NOTE — Progress Notes (Signed)
 "  Established Patient Office Visit  Subjective   Patient ID: Connie Lee, female    DOB: 03/15/1941  Age: 84 y.o. MRN: [id number]  Chief Complaint  Patient presents with   Osteoporosis    Connie Lee is a 84 y.o. person with medical hx listed below who presents today for follow up elevated creatinine and low vitamin D .   Endorses increasing difficulties with urge incontinence over the last year. Has to wear adult diapers, wakes multiple times a night to urinate, and has accidents unless she is unable to get to the bathroom in less than a minute. Denies dysuria, vulvar discomfort, sensation of incomplete emptying, hematuria, or numbness or tingling.   Patient Active Problem List   Diagnosis Date Noted   Elevated creatine kinase 12/04/2024   Urgency incontinence 12/04/2024   Elevated BP reading w/ no diagnosis of HTN 08/22/2024   Impacted cerumen of right ear 08/22/2024   Anemia 08/22/2024   Encounter for lipid screening for cardiovascular disease 08/22/2024   Vitamin D  deficiency 08/22/2024   Hx of fracture of left hip 10/02/2021      ROS Refer to HPI    Objective:     Outpatient Encounter Medications as of 12/04/2024  Medication Sig   mirabegron  ER (MYRBETRIQ ) 25 MG TB24 tablet Take 1 tablet (25 mg total) by mouth daily.   Multiple Minerals-Vitamins (CITRACAL MAXIMUM PLUS PO) Take 1 tablet by mouth daily.   [DISCONTINUED] Multiple Vitamins-Minerals (PRESERVISION AREDS PO) Take by mouth daily. (Patient not taking: Reported on 08/21/2024)   No facility-administered encounter medications on file as of 12/04/2024.    BP 132/82   Pulse 88   Ht 5' 4 (1.626 m)   Wt 134 lb (60.8 kg)   SpO2 96%   BMI 23.00 kg/m  BP Readings from Last 3 Encounters:  12/04/24 132/82  08/21/24 (!) 144/80  06/04/23 128/78    Physical Exam Constitutional:      Comments: frail  HENT:     Mouth/Throat:     Mouth: Mucous membranes are moist.     Pharynx: Oropharynx is clear.   Eyes:     Extraocular Movements: Extraocular movements intact.     Pupils: Pupils are equal, round, and reactive to light.  Cardiovascular:     Rate and Rhythm: Normal rate and regular rhythm.  Pulmonary:     Effort: Pulmonary effort is normal.     Breath sounds: No rhonchi or rales.  Abdominal:     General: Abdomen is flat. Bowel sounds are normal. There is no distension.     Palpations: Abdomen is soft.     Tenderness: There is no abdominal tenderness. There is no right CVA tenderness or left CVA tenderness.  Musculoskeletal:        General: Normal range of motion.     Right lower leg: No edema.     Left lower leg: No edema.  Skin:    General: Skin is warm and dry.     Capillary Refill: Capillary refill takes less than 2 seconds.  Neurological:     General: No focal deficit present.     Mental Status: She is alert and oriented to person, place, and time.  Psychiatric:        Mood and Affect: Mood normal.        Behavior: Behavior normal.        12/04/2024    8:21 AM 08/21/2024    8:43 AM 07/16/2024   10:52 AM  Depression screen PHQ 2/9  Decreased Interest 0 0 0  Down, Depressed, Hopeless 0 0 0  PHQ - 2 Score 0 0 0  Altered sleeping   0  Tired, decreased energy   0  Change in appetite   0  Feeling bad or failure about yourself    0  Trouble concentrating   0  Moving slowly or fidgety/restless   0  Suicidal thoughts   0  PHQ-9 Score   0   Difficult doing work/chores   Not difficult at all     Data saved with a previous flowsheet row definition       12/04/2024    8:21 AM 08/21/2024    8:43 AM 05/18/2023    9:50 AM 10/13/2021   10:54 AM  GAD 7 : Generalized Anxiety Score  Nervous, Anxious, on Edge 0 0  0  0   Control/stop worrying 0 0  0  0   Worry too much - different things   0  0   Trouble relaxing   0  0   Restless   0  0   Easily annoyed or irritable   0  0   Afraid - awful might happen   0  0   Total GAD 7 Score   0 0  Anxiety Difficulty   Not  difficult at all      Data saved with a previous flowsheet row definition    No results found for any visits on 12/04/24.  Last CBC Lab Results  Component Value Date   WBC 6.9 08/21/2024   HGB 14.5 08/21/2024   HCT 44.9 08/21/2024   MCV 95 08/21/2024   MCH 30.8 08/21/2024   RDW 12.5 08/21/2024   PLT 161 08/21/2024   Last metabolic panel Lab Results  Component Value Date   GLUCOSE 93 08/21/2024   NA 140 08/21/2024   K 4.1 08/21/2024   CL 101 08/21/2024   CO2 23 08/21/2024   BUN 21 08/21/2024   CREATININE 1.03 (H) 08/21/2024   EGFR 54 (L) 08/21/2024   CALCIUM  9.4 08/21/2024   PHOS 3.3 11/12/2020   PROT 7.5 08/21/2024   ALBUMIN 4.8 (H) 08/21/2024   LABGLOB 2.7 08/21/2024   BILITOT 0.5 08/21/2024   ALKPHOS 59 08/21/2024   AST 25 08/21/2024   ALT 15 08/21/2024   ANIONGAP 6 10/04/2021   Last lipids Lab Results  Component Value Date   CHOL 216 (H) 08/21/2024   HDL 68 08/21/2024   LDLCALC 132 (H) 08/21/2024   TRIG 88 08/21/2024   CHOLHDL 3.2 08/21/2024   Last hemoglobin A1c No results found for: HGBA1C    The ASCVD Risk score (Arnett DK, et al., 2019) failed to calculate for the following reasons:   The 2019 ASCVD risk score is only valid for ages 89 to 36   * - Cholesterol units were assumed    Assessment & Plan:  Vitamin D  deficiency Assessment & Plan: Repeat vitamin D  today  Orders: -     VITAMIN D  25 Hydroxy (Vit-D Deficiency, Fractures)  Elevated serum creatinine -     Renal function panel  Elevated creatine kinase Assessment & Plan: Likely due to decrease water intake due to urgency incontinence. Has been trying to drink more water lately. Rare NSAID use <2x per month. RFP today   Urgency incontinence Assessment & Plan: Advised she start home kegel exercises, she has done this sporadically in the past. Will trial mirabegron  25 mg daily. Would  like to avoid antimuscarinic given age and hx of fragility fracture.    Other orders -      Mirabegron  ER; Take 1 tablet (25 mg total) by mouth daily.  Dispense: 30 tablet; Refill: 3     Return in about 3 months (around 03/03/2025) for OAB.    Harlene Saddler, MD "

## 2024-12-05 ENCOUNTER — Ambulatory Visit: Payer: Self-pay | Admitting: Student

## 2024-12-05 LAB — RENAL FUNCTION PANEL
Albumin: 4.7 g/dL (ref 3.7–4.7)
BUN/Creatinine Ratio: 14 (ref 12–28)
BUN: 16 mg/dL (ref 8–27)
CO2: 20 mmol/L (ref 20–29)
Calcium: 10 mg/dL (ref 8.7–10.3)
Chloride: 100 mmol/L (ref 96–106)
Creatinine, Ser: 1.11 mg/dL — ABNORMAL HIGH (ref 0.57–1.00)
Glucose: 87 mg/dL (ref 70–99)
Phosphorus: 3.6 mg/dL (ref 3.0–4.3)
Potassium: 4.2 mmol/L (ref 3.5–5.2)
Sodium: 139 mmol/L (ref 134–144)
eGFR: 49 mL/min/{1.73_m2} — ABNORMAL LOW

## 2024-12-05 LAB — VITAMIN D 25 HYDROXY (VIT D DEFICIENCY, FRACTURES): Vit D, 25-Hydroxy: 29.2 ng/mL — ABNORMAL LOW (ref 30.0–100.0)

## 2025-03-04 ENCOUNTER — Ambulatory Visit: Admitting: Student

## 2025-07-22 ENCOUNTER — Ambulatory Visit

## 2025-08-26 ENCOUNTER — Encounter: Admitting: Student
